# Patient Record
Sex: Female | Born: 1985 | Race: White | Hispanic: No | Marital: Single | Smoking: Never smoker
Health system: Southern US, Community
[De-identification: ages and names within clinical notes are randomized; demographics above are authoritative.]

## PROBLEM LIST (undated history)

## (undated) DIAGNOSIS — Z789 Other specified health status: Secondary | ICD-10-CM

## (undated) DIAGNOSIS — B009 Herpesviral infection, unspecified: Secondary | ICD-10-CM

## (undated) HISTORY — DX: Herpesviral infection, unspecified: B00.9

---
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
## 2008-06-27 ENCOUNTER — Other Ambulatory Visit: Admission: RE | Admit: 2008-06-27 | Discharge: 2008-06-27 | Payer: Self-pay | Admitting: Obstetrics & Gynecology

## 2008-10-22 ENCOUNTER — Inpatient Hospital Stay (HOSPITAL_COMMUNITY): Admission: AD | Admit: 2008-10-22 | Discharge: 2008-10-24 | Payer: Self-pay | Admitting: Obstetrics & Gynecology

## 2008-10-22 ENCOUNTER — Ambulatory Visit: Payer: Self-pay | Admitting: Family Medicine

## 2010-01-12 ENCOUNTER — Other Ambulatory Visit: Admission: RE | Admit: 2010-01-12 | Discharge: 2010-01-12 | Payer: Self-pay | Admitting: Obstetrics & Gynecology

## 2010-12-31 ENCOUNTER — Other Ambulatory Visit (HOSPITAL_COMMUNITY)
Admission: RE | Admit: 2010-12-31 | Discharge: 2010-12-31 | Disposition: A | Discharge status: Home / Self Care | Payer: Medicaid Other | Source: Ambulatory Visit | Attending: Obstetrics and Gynecology | Admitting: Obstetrics and Gynecology

## 2010-12-31 DIAGNOSIS — Z01419 Encounter for gynecological examination (general) (routine) without abnormal findings: Secondary | ICD-10-CM | POA: Insufficient documentation

## 2010-12-31 DIAGNOSIS — Z113 Encounter for screening for infections with a predominantly sexual mode of transmission: Secondary | ICD-10-CM | POA: Insufficient documentation

## 2011-01-02 ENCOUNTER — Encounter: Payer: Self-pay | Admitting: Obstetrics & Gynecology

## 2011-01-02 DIAGNOSIS — Z331 Pregnant state, incidental: Secondary | ICD-10-CM | POA: Insufficient documentation

## 2011-01-11 LAB — CBC
HCT: 28 % — ABNORMAL LOW (ref 36.0–46.0)
HCT: 32.4 % — ABNORMAL LOW (ref 36.0–46.0)
Hemoglobin: 11 g/dL — ABNORMAL LOW (ref 12.0–15.0)
Hemoglobin: 9.5 g/dL — ABNORMAL LOW (ref 12.0–15.0)
MCHC: 33.9 g/dL (ref 30.0–36.0)
MCHC: 34 g/dL (ref 30.0–36.0)
MCV: 92.8 fL (ref 78.0–100.0)
MCV: 93 fL (ref 78.0–100.0)
Platelets: 128 10*3/uL — ABNORMAL LOW (ref 150–400)
Platelets: 152 10*3/uL (ref 150–400)
RBC: 3.01 MIL/uL — ABNORMAL LOW (ref 3.87–5.11)
RBC: 3.49 MIL/uL — ABNORMAL LOW (ref 3.87–5.11)
RDW: 13.9 % (ref 11.5–15.5)
RDW: 14.1 % (ref 11.5–15.5)
WBC: 11.2 10*3/uL — ABNORMAL HIGH (ref 4.0–10.5)
WBC: 15.4 10*3/uL — ABNORMAL HIGH (ref 4.0–10.5)

## 2011-01-11 LAB — RH IMMUNE GLOB WKUP(>/=20WKS)(NOT WOMEN'S HOSP): Fetal Screen: NEGATIVE

## 2011-01-11 LAB — RPR: RPR Ser Ql: NONREACTIVE

## 2011-02-12 NOTE — Op Note (Signed)
NAME:  Olivia Small, Olivia Small NO.:  [id number]   MEDICAL RECORD NO.:  [id number]                   PATIENT TYPE:  INP   LOCATION:  A417                                 FACILITY:  [facility]   PHYSICIAN:  Langley Gauss, M.D.                DATE OF BIRTH:  [birth year]   DATE OF PROCEDURE:  08/16/2002  DATE OF DISCHARGE:  08/18/2002                                 OPERATIVE REPORT   PROCEDURE:  Placement of continuous lumbar epidural analgesia at the L3-4  interspace performed by Langley Gauss, M.D.   COMPLICATIONS:  There were findings of a probable wet tap prior to  successful epidural placement with findings of a small amount of clear  cerebrospinal fluid.  Epidural was thereafter placed without complications.  Plan is to place immediate blood patch following delivery and prior to  removal of the epidural.   DESCRIPTION OF PROCEDURE:  Appropriate informed consent was obtained prior  to the procedure.  The patient was placed in a seated position, bony  landmarks were identified, and the L3-4 interspace was chosen.  The  patient's back was sterilely prepped and draped utilizing the epidural kit.  Five cubic centimeters 1% lidocaine plain injected at the midline of the L3-  4 interspace to raise a small skin wheal.  The 17-gauge Tuohy-Schlif needle  was then utilized with loss of resistance to an air-filled glass syringe to  identify the epidural space.  On the initial attempt, the bony spinous  process was encountered; thus, the epidural needle was removed and directed  slightly caudad.  On this second attempt, no loss of resistance was  obtained; thus, the epidural needle was removed, at which time there was  finding of about 2 cc of clear apparently cerebrospinal fluid within the  glass syringe, indicating a probable wet tap.  On the third attempt the  epidural was easily placed with excellent loss of resistance consistent with  entry into the epidural space,  with excellent loss of resistance obtained.  Initial test dose of 5 cc 1% lidocaine plus epinephrine injected through the  epidural needle, and no signs of CSF or intravascular injection obtained;  thus, the epidural catheter was inserted to a depth of 4 cm.  Epidural  needle was removed.  Aspiration test was negative.  Second test dose of 2 cc  of 1.5% lidocaine plus epinephrine injected through the epidural catheter.  Again no signs of CSF or intravascular injection obtained; thus, the  catheter was secured into place.  The patient was returned to the left  lateral supine position, connected to the infusion pump containing the  standard mixture.  She will be treated with a bolus of 10 cc followed by a  continuous infusion rate of 14 cc/hr.  Following completion of the  procedure, the patient is having tingling and warmth in both lower  extremities, consistent with properly setting-up epidural block.  No other  signs of CSF or intravascular injection have been obtained; thus, the  patient is continued on the infusion pump.  She is notified at this time of  the probable wet tap and the possibility of a spinal headache occurring.  She, however, is having excellent labor relief at this time, fetal heart  rate remains reassuring, and thus she is continued on the infusion pump.                                               Langley Gauss, M.D.    DC/MEDQ  D:  08/20/2002  T:  08/20/2002  Job:  [id number]

## 2011-02-12 NOTE — H&P (Signed)
   NAME:  Olivia Small, Olivia Small                         ACCOUNT NO.:  [id number]   MEDICAL RECORD NO.:  [id number]                   PATIENT TYPE:  INP   LOCATION:  A417                                 FACILITY:  [facility]   PHYSICIAN:  Langley Gauss, M.D.                DATE OF BIRTH:  05-Aug-1986   DATE OF ADMISSION:  08/16/2002  DATE OF DISCHARGE:  08/18/2002                                HISTORY & PHYSICAL   HISTORY OF PRESENT ILLNESS:  A 25 year old gravida 1, para 0, 38+ week  gestation is admitted for induction of labor.  The patient's prenatal course  is complicated by late care.  Her initial visit was at [redacted] weeks gestation.  She does, however, have a certain last menstrual period of 11/22/01 and  serial ultrasounds have documented adequate fetal growth and normal anatomic  cervix.  The patient is O negative blood type.  She did receive RhoGAM  injection on 06/06/02.   PAST MEDICAL HISTORY:   PAST SURGICAL HISTORY:  Negative.   ALLERGIES:  No known drug allergies.   CURRENT MEDICATIONS:  1. Prenatal vitamins.  2. Lortabs on a p.r.n. basis for headaches throughout the pregnancy with     some relief.   PHYSICAL EXAMINATION:  GENERAL: Immature adolescent, 25 years old.  VITAL SIGNS: Height 5 feet 4 inches, blood pressure 118/70, pre-pregnancy  was 160's most recent eight 200.  Pulse of 80.  Respiratory rate of 20.  HEENT: Negative.  No adenopathy.  NECK: Supple.  Thyroid nonpalpable.  LUNGS: Clear.  CARDIOVASCULAR: Regular rate and rhythm.  ABDOMEN: Soft and nontender.  Fundal height is 36 cm.  She is vertex  presentation by [employer].  PELVIC: Reveals normal external genitalia.  No lesions or ulcerations  identified.  No vaginal bleeding or leakage of fluid.  The cervix is 3 cm  dilated, __________ % effaced, -1 station, vertex presentation.  External  monitor reveals a very strong fetal strong fetal heart rate.  There are very  good uterine contractions identified.   Estimated fetal weight is 7 pounds.   ASSESSMENT:  1. 38 + week intrauterine pregnancy.  38. 25 year old adolescent pregnancy.  3.     The cervix is favorable for induction.  4. The patient only has her mother for a support person during the course of     labor.   PLAN:  To person with amniotomy.  Thereafter augment with Pitocin or induce  with Pitocin as clinically indicated.                                               Langley Gauss, M.D.    DC/MEDQ  D:  08/20/2002  T:  08/20/2002  Job:  [id number]

## 2011-02-12 NOTE — Op Note (Signed)
   NAME:  Olivia Small, Olivia Small                         ACCOUNT NO.:  [id number]   MEDICAL RECORD NO.:  [id number]                   PATIENT TYPE:  OBV   LOCATION:  LDR1                                 FACILITY:  [facility]   PHYSICIAN:  Langley Gauss, M.D.                DATE OF BIRTH:  [birth year]   DATE OF PROCEDURE:  08/16/2002  DATE OF DISCHARGE:  08/19/2002                                 OPERATIVE REPORT   PROCEDURE PERFORMED:  Placement of immediate blood patch through the  epidural for prophylactic relief of spinal headache as a complication of a  wet tap, an accepted complication at the time of placement of the labor  epidural.   SURGEON:  Langley Gauss, M.D.   SUMMARY:  The planned procedure had been discussed with the patient during  the course of labor and again immediately just prior to performing this.  A  separate consent form has been signed.  The patient is delivered  uneventfully.  The epidural catheter is still in place.  Utilizing sterile  technique, 10 cc of the patient's own blood is obtained from the right  antecubital region.  This 10 cc of blood is then injected through the  epidural catheter with minimal resistance obtained.  Thus, placement of the  blood patch was performed through the original epidural catheter.  The  epidural catheter was then removed, and a Band-Aid was placed.  The patient  was instructed to lie flat on her back x30 minutes duration.  Pertinently,  she has no complaints of headaches during the course of labor and  immediately in the postpartum period.                                               Langley Gauss, M.D.    DC/MEDQ  D:  08/20/2002  T:  08/20/2002  Job:  [id number]

## 2011-02-12 NOTE — Discharge Summary (Signed)
NAME:  Olivia Small, Olivia Small                         ACCOUNT NO.:  [id number]   MEDICAL RECORD NO.:  [id number]                   PATIENT TYPE:  INP   LOCATION:  A417                                 FACILITY:  [facility]   PHYSICIAN:  Langley Gauss, M.D.                DATE OF BIRTH:  Dec 02, 1985   DATE OF ADMISSION:  08/16/2002  DATE OF DISCHARGE:  08/18/2002                                 DISCHARGE SUMMARY   DISCHARGE DIAGNOSES:  1. A 38+ intrauterine pregnancy, delivered.  2. Anemia secondary to blood loss.  The patient did experience post partum     bleeding secondary to uterine apnea.   PROCEDURES:  1. Placement of continuous lumbar epidural analgesia 08/16/02.  2. Placement of epidural blood patch 08/16/02.  3. Spontaneous assisted vaginal delivery of 7 pound 6 ounce female infant on     08/19/02 with repair of episiotomy.   LABORATORY DATA:  Admission hematocrit 11.3/32.5 with a white count of 9.6.  Post partum day #1 hemoglobin 9.6, hematocrit hemoglobin 27.5 with a white  count of 19.0.   HOSPITAL COURSE:  See previous dictations.  The patient was admitted, had  the epidural, and delivered on 08/16/02.  An epidural blood patch was  performed immediately at the time of delivery.  About one hour following  delivery the patient was noted to have fairly heavy vaginal bleeding with  passage of golf ball sized clots.  Thus she was reevaluated by the  physician.  Notably at this time the patient had been treated with  additional IV Pitocin solution as well as 0.2 mg of iron Methergine and 1  ampule of Hemabate with minimal relief.  When I examined the patient on the  a.m. of 08/17/02, bimanual of the intravaginal vault as well as compression  of the uterine fundus resulted in evacuation of about 100 cc of clots within  the lower uterine segment.  Excellent uterine tone was achieved immediately  following this with excellent uterine tone achieved and a fundal height now  of only 16  weeks size.  Following this, the patient was treated with an  additional 0.2 mg of iron Methergine to prevent further apnea tonic  episodes.  The patient thereafter had no excessive complaints of vaginal  bleeding.  She was able to ambulate very well.  During the remainder of the  hospital course she had no complaints of headaches.  She was able to  ambulate without difficulty.  She had no additional heavy bleeding.  On the  day of discharge the hemoglobin has now colligated to 7.6.  However, upon  examination the patient has again has no excessive bleeding.  She has no  complaints of headaches.  No dizziness or unstable gait.  Importantly no  headaches.  I discussed with the patient that we did perform the blood patch  at the time of delivery.  The patient was thus discharged  to home on  08/18/02.   DISCHARGE MEDICATIONS:  1. Tylenol No. 3.  2. Hemocyte-F 1 p.o. q.d. #30 without refills for the iron deficiency     anemia.                                               Langley Gauss, M.D.    DC/MEDQ  D:  08/20/2002  T:  08/20/2002  Job:  [id number]

## 2011-02-12 NOTE — Op Note (Signed)
NAME:  Olivia Small, Olivia Small                         ACCOUNT NO.:  [id number]   MEDICAL RECORD NO.:  [id number]                   PATIENT TYPE:  OBV   LOCATION:  LDR1                                 FACILITY:  [facility]   PHYSICIAN:  Lazaro Arms, M.D.                DATE OF BIRTH:  08-22-1986   DATE OF PROCEDURE:  08/19/2002  DATE OF DISCHARGE:  08/19/2002                                 OPERATIVE REPORT   PROCEDURE:  Blood patch.   INDICATIONS:  [name] is a 25 year old white female who delivered on Thursday,  November 20.  At that time, he had an epidural placed and evidently  according to her history as it was being placed, the patient complained of a  headache.  Again, by history, Dr. Lisette Grinder seemed to think it was probably a  wet tap and placed a blood patch at that time.  The patient went home from  the hospital yesterday, doing pretty well. She states she had some headache  throughout, but it had been better.  However, she stated that yesterday  afternoon, it started getting worse again, and it was postural.  As long as  she was lying down flat, she had no headache, but when she got up, she had a  pounding bifrontal headache.  She had no vomiting but some nausea.  Her  mother called labor and delivery this afternoon, and I talked to her after  that and confirmed the symptoms of spinal headache.  I thought at some point  that maybe this clot had become dislodged from the blood patch and she was  starting to leak spinal fluid again.  She does not have any fever, and there  is no evidence of abscess or any other infections at the sites of the needle  punctures in her back.  It appears that the previous puncture sites maybe  were L1-L2 and L2-L3.   The patient was placed in the sitting position. Her iliac crests were  identified.  The spinous processes were identified, and the L2-L3 interspace  was marked.  Betadine prep was used.  1% lidocaine was injected as a skin  wheal, and I placed  a 17-gauge Tuohy needle into the epidural space with one  pass without difficulty.  At the same time, my R.N. was drawing 20 cc of  blood.  After she had done that, I reconfirmed the epidural space and  injected 20 cc of autologous blood into the epidural space without  difficulty.  The patient had no ill effects from this.  She stated that she  got immediate relief from her headache.  I then laid her down for five  minutes and came back in and did postural changes, and again, she states she  had absolutely no headache and she was feeling great.  As a result, she was  discharged to home to follow up as scheduled.  She  was given routine  aftercare instructions.                                               Lazaro Arms, M.D.    [name]  D:  08/19/2002  T:  08/19/2002  Job:  [id number]   cc:   Langley Gauss, M.D.  [address]  [place]  [place] [address]  Fax: [phone]

## 2011-02-12 NOTE — Op Note (Signed)
NAME:  Olivia Small, Olivia Small                         ACCOUNT NO.:  [id number]   MEDICAL RECORD NO.:  [id number]                   PATIENT TYPE:  INP   LOCATION:  A417                                 FACILITY:  [facility]   PHYSICIAN:  Langley Gauss, M.D.                DATE OF BIRTH:  November 29, 1985   DATE OF PROCEDURE:  08/16/2002  DATE OF DISCHARGE:  08/18/2002                                 OPERATIVE REPORT   DIAGNOSIS:  Thirty-eight plus week intrauterine pregnancy for induction of  labor.   PROCEDURES:  1. Spontaneous assisted vaginal delivery of a 7 pound 6 ounce female infant.  2. Midline episiotomy and repair.  3. Placement of immediate blood patch following delivery.   ANALGESIA FOR DELIVERY:  Continuous lumbar epidural anesthesia.  This was  supplemented with 30 cc of 1% lidocaine at time of delivery.   PHYSICIAN:  Delivery performed by Langley Gauss, M.D.   SPECIMENS:  Arterial cord gas and cord blood obtained, sent to laboratory  for identification.  The placenta was noted to deliver intact with a three-  vessel umbilical cord.   DESCRIPTION OF PROCEDURE:  The patient was admitted for induction of labor.  Epidural had been placed with the findings of probable wet tap.  The  epidural, however, functioned very well throughout the remainder of the  labor course.  At 9 cm dilatation, though, the patient began having  significant pelvic and perineal pressure with a desire to push; thus, at  that time she was treated with a bolus of 2% lidocaine to achieve perineal  analgesia.  Thereafter the patient progressed normally along the labor curve  to complete dilatation, at which time she began having an urge to push.  She  was placed in the dorsal lithotomy position and prepped and draped in the  usual sterile manner.  The patient pushed well during a short second stage  of labor with distention of the perineum, 30 cc of 1% lidocaine injected in  the midline and perineal body.  A small  midline episiotomy was performed.  The infant then delivered in a direct OA position over this midline  episiotomy without extension.  Mouth and nares were bulb-suctioned of clear  amniotic fluid, and renewed expulsive efforts resulted in the spontaneous  rotation to a left anterior shoulder position and with gentle traction  combined with expulsive efforts, we ultimately delivered the anterior  shoulder as well as the remainder of the infant without difficulties.  A  spontaneous and vigorous breathing cry was noted.  Umbilical cord was milked  toward the infant, the cord was doubly clamped and cut, and the infant was  given to the nursing staff for immediate assessment.  Arterial cord gas and  cord blood were then obtained.  Gentle traction on the umbilical cord  resulted in separation, which upon examination appeared to be an intact  three-vessel placenta with attached cord.  Examination of the genital tract  reveals midline episiotomy that is not extended.  This is easily  repaired utilizing 0 chromic in a running locked fashion on the vaginal  mucosa, followed by a two-layer closure of 0 chromic on the perineal body.  Both the mother and infant doing well following delivery.  The patient has  no complaints of headaches.                                                 Langley Gauss, M.D.    DC/MEDQ  D:  08/20/2002  T:  08/20/2002  Job:  [id number]

## 2011-07-21 ENCOUNTER — Encounter (HOSPITAL_COMMUNITY): Payer: Self-pay

## 2011-07-21 ENCOUNTER — Inpatient Hospital Stay (HOSPITAL_COMMUNITY)
Admission: RE | Admit: 2011-07-21 | Discharge: 2011-07-22 | DRG: 775 | Disposition: A | Discharge status: Home / Self Care | Payer: Medicaid Other | Source: Ambulatory Visit | Attending: Obstetrics and Gynecology | Admitting: Obstetrics and Gynecology

## 2011-07-21 HISTORY — DX: Other specified health status: Z78.9

## 2011-07-21 LAB — ABO/RH: RH Type: NEGATIVE

## 2011-07-21 LAB — CBC
MCH: 29.9 pg (ref 26.0–34.0)
MCHC: 33.2 g/dL (ref 30.0–36.0)
Platelets: 121 10*3/uL — ABNORMAL LOW (ref 150–400)

## 2011-07-21 LAB — HEPATITIS B SURFACE ANTIGEN: Hepatitis B Surface Ag: NEGATIVE

## 2011-07-21 LAB — RPR: RPR Ser Ql: NONREACTIVE

## 2011-07-21 LAB — STREP B DNA PROBE: GBS: POSITIVE

## 2011-07-21 LAB — RUBELLA ANTIBODY, IGM: Rubella: IMMUNE

## 2011-07-21 MED ORDER — LACTATED RINGERS IV SOLN: 500.0000 mL | INTRAVENOUS | Status: DC | PRN

## 2011-07-21 MED ORDER — WITCH HAZEL-GLYCERIN EX PADS: 1.0000 "application " | MEDICATED_PAD | CUTANEOUS | Status: DC | PRN

## 2011-07-21 MED ORDER — ONDANSETRON HCL 4 MG/2ML IJ SOLN: 4.0000 mg | INTRAMUSCULAR | Status: DC | PRN

## 2011-07-21 MED ORDER — BENZOCAINE-MENTHOL 20-0.5 % EX AERO: 1.0000 "application " | INHALATION_SPRAY | CUTANEOUS | Status: DC | PRN

## 2011-07-21 MED ORDER — PENICILLIN G POTASSIUM 5000000 UNITS IJ SOLR
5.0000 10*6.[IU] | Freq: Once | INTRAVENOUS | Status: AC
  Administered 2011-07-21: 5 10*6.[IU] via INTRAVENOUS
  Filled 2011-07-21: qty 5

## 2011-07-21 MED ORDER — OXYCODONE-ACETAMINOPHEN 5-325 MG PO TABS: 1.0000 | ORAL_TABLET | ORAL | Status: DC | PRN

## 2011-07-21 MED ORDER — NALBUPHINE SYRINGE 5 MG/0.5 ML
5.0000 mg | INJECTION | INTRAMUSCULAR | Status: DC | PRN
  Administered 2011-07-21: 5 mg via INTRAVENOUS
  Filled 2011-07-21 (×2): qty 0.5

## 2011-07-21 MED ORDER — PRENATAL PLUS 27-1 MG PO TABS
1.0000 | ORAL_TABLET | Freq: Every day | ORAL | Status: DC
  Administered 2011-07-22: 1 via ORAL
  Filled 2011-07-21: qty 1

## 2011-07-21 MED ORDER — PENICILLIN G POTASSIUM 5000000 UNITS IJ SOLR
2.5000 10*6.[IU] | INTRAVENOUS | Status: DC
  Administered 2011-07-21 (×2): 2.5 10*6.[IU] via INTRAVENOUS
  Filled 2011-07-21 (×5): qty 2.5

## 2011-07-21 MED ORDER — SIMETHICONE 80 MG PO CHEW: 80.0000 mg | CHEWABLE_TABLET | ORAL | Status: DC | PRN

## 2011-07-21 MED ORDER — ACETAMINOPHEN 325 MG PO TABS: 650.0000 mg | ORAL_TABLET | ORAL | Status: DC | PRN

## 2011-07-21 MED ORDER — ONDANSETRON HCL 4 MG/2ML IJ SOLN: 4.0000 mg | Freq: Four times a day (QID) | INTRAMUSCULAR | Status: DC | PRN

## 2011-07-21 MED ORDER — PHENYLEPHRINE 40 MCG/ML (10ML) SYRINGE FOR IV PUSH (FOR BLOOD PRESSURE SUPPORT)
80.0000 ug | PREFILLED_SYRINGE | INTRAVENOUS | Status: DC | PRN
  Filled 2011-07-21: qty 5

## 2011-07-21 MED ORDER — IBUPROFEN 600 MG PO TABS
600.0000 mg | ORAL_TABLET | Freq: Four times a day (QID) | ORAL | Status: DC | PRN
  Filled 2011-07-21: qty 1

## 2011-07-21 MED ORDER — LACTATED RINGERS IV SOLN: 500.0000 mL | Freq: Once | INTRAVENOUS | Status: DC

## 2011-07-21 MED ORDER — LACTATED RINGERS IV SOLN
INTRAVENOUS | Status: DC
  Administered 2011-07-21: 09:00:00 via INTRAVENOUS

## 2011-07-21 MED ORDER — ONDANSETRON HCL 4 MG PO TABS: 4.0000 mg | ORAL_TABLET | ORAL | Status: DC | PRN

## 2011-07-21 MED ORDER — DIPHENHYDRAMINE HCL 25 MG PO CAPS: 25.0000 mg | ORAL_CAPSULE | Freq: Four times a day (QID) | ORAL | Status: DC | PRN

## 2011-07-21 MED ORDER — TERBUTALINE SULFATE 1 MG/ML IJ SOLN: 0.2500 mg | Freq: Once | INTRAMUSCULAR | Status: DC | PRN

## 2011-07-21 MED ORDER — OXYTOCIN 20 UNITS IN LACTATED RINGERS INFUSION - SIMPLE: 125.0000 mL/h | Freq: Once | INTRAVENOUS | Status: DC

## 2011-07-21 MED ORDER — ZOLPIDEM TARTRATE 5 MG PO TABS: 5.0000 mg | ORAL_TABLET | Freq: Every evening | ORAL | Status: DC | PRN

## 2011-07-21 MED ORDER — OXYTOCIN 20 UNITS IN LACTATED RINGERS INFUSION - SIMPLE
1.0000 m[IU]/min | INTRAVENOUS | Status: DC
  Administered 2011-07-21: 2 m[IU]/min via INTRAVENOUS

## 2011-07-21 MED ORDER — DIBUCAINE 1 % RE OINT: 1.0000 "application " | TOPICAL_OINTMENT | RECTAL | Status: DC | PRN

## 2011-07-21 MED ORDER — EPHEDRINE 5 MG/ML INJ
10.0000 mg | INTRAVENOUS | Status: DC | PRN
  Filled 2011-07-21: qty 4

## 2011-07-21 MED ORDER — LANOLIN HYDROUS EX OINT: TOPICAL_OINTMENT | CUTANEOUS | Status: DC | PRN

## 2011-07-21 MED ORDER — DIPHENHYDRAMINE HCL 50 MG/ML IJ SOLN: 12.5000 mg | INTRAMUSCULAR | Status: DC | PRN

## 2011-07-21 MED ORDER — FENTANYL 2.5 MCG/ML BUPIVACAINE 1/10 % EPIDURAL INFUSION (WH - ANES): 14.0000 mL/h | INTRAMUSCULAR | Status: DC

## 2011-07-21 MED ORDER — FLEET ENEMA 7-19 GM/118ML RE ENEM: 1.0000 | ENEMA | RECTAL | Status: DC | PRN

## 2011-07-21 MED ORDER — IBUPROFEN 600 MG PO TABS
600.0000 mg | ORAL_TABLET | Freq: Four times a day (QID) | ORAL | Status: DC
  Administered 2011-07-21 – 2011-07-22 (×4): 600 mg via ORAL
  Filled 2011-07-21 (×3): qty 1

## 2011-07-21 MED ORDER — LIDOCAINE HCL (PF) 1 % IJ SOLN
30.0000 mL | INTRAMUSCULAR | Status: DC | PRN
  Filled 2011-07-21: qty 30

## 2011-07-21 MED ORDER — CITRIC ACID-SODIUM CITRATE 334-500 MG/5ML PO SOLN: 30.0000 mL | ORAL | Status: DC | PRN

## 2011-07-21 MED ORDER — OXYCODONE-ACETAMINOPHEN 5-325 MG PO TABS: 2.0000 | ORAL_TABLET | ORAL | Status: DC | PRN

## 2011-07-21 MED ORDER — TETANUS-DIPHTH-ACELL PERTUSSIS 5-2.5-18.5 LF-MCG/0.5 IM SUSP: 0.5000 mL | Freq: Once | INTRAMUSCULAR | Status: DC

## 2011-07-21 MED ORDER — OXYTOCIN BOLUS FROM INFUSION
500.0000 mL | Freq: Once | INTRAVENOUS | Status: DC
  Filled 2011-07-21: qty 500
  Filled 2011-07-21: qty 1000

## 2011-07-21 MED ORDER — SENNOSIDES-DOCUSATE SODIUM 8.6-50 MG PO TABS: 2.0000 | ORAL_TABLET | Freq: Every day | ORAL | Status: DC

## 2011-07-21 NOTE — Progress Notes (Signed)
   Olivia Small is a 25 y.o. [phone] at [redacted]w[redacted]d  admitted for induction of labor due to Elective at term.  Subjective: "I feel like I'm dying".  Does not want epidural  Objective: BP 130/85  Pulse 103  Temp(Src) 97.5 F (36.4 C) (Oral)  Resp 20  Ht 5\' 7"  (1.702 m)  Wt 105.688 kg (233 lb)  BMI 36.49 kg/m2  LMP 10/22/2010    FHT:  FHR: 140 bpm, variability: moderate,  accelerations:  Present,  decelerations:  Absent UC:   regular, every 2-3 minutes SVE:   Dilation: 6 Effacement (%): 90 Station: -2;-1 Exam by:: McGrail RN  Labs: Lab Results  Component Value Date   WBC 11.1* 07/21/2011   HGB 10.6* 07/21/2011   HCT 31.9* 07/21/2011   MCV 90.1 07/21/2011   PLT 121* 07/21/2011    Assessment / Plan: Induction of labor due to hx precip delivery in ambulance,  progressing well on pitocin  Labor: Progressing normally Fetal Wellbeing:  Category I Pain Control:  will get in bath tub Anticipated MOD:  NSVD  Olivia Small 07/21/2011, 5:36 PM

## 2011-07-21 NOTE — H&P (Signed)
Subjective:  Olivia Small is a 25 y.o. G3 P2-0-0-2 female with [facility] 07/29/2011 at 33 and 0/[redacted] weeks gestation who is being admitted for induction of labor.  Her current obstetrical history is significant for precipitous delivery with the last pregnancy while en route from [place] to [facility] .  Patient reports mild contractions since last office visit when membranes were "stripped" on 07/20/2011.   Fetal Movement: normal.   No rupture of membranes or bleeding.  Objective:   Vital signs in last 24 hours:     General:   alert, cooperative, appears stated age and in good overall health.  Skin:   normal  HEENT:  PERRLA  Lungs:   clear to auscultation bilaterally  Heart:   regular rate and rhythm, S1, S2 normal, no murmur, click, rub or gallop     Abdomen:  gravid uterus, consistent with dates,, 38 cm fundal height  Pelvis:  Cervix: 3cm/ 60% effaced/ -1-2 sta Uterus: 38 cm Fundal ht  FHT:  140's BPM  Uterine Size: size equals dates  Presentations: cephalic  Cervix:    Dilation: 3cm   Effacement: 60   Station:  -1   Consistency: medium   Position: middle   Lab Review  O, Rh -  ZOX:WRUEAVW declined  One hour GTT: Normal 2 hr test    Assessment/Plan:  39 and 0/[redacted] weeks gestation. Not in labor. Obstetrical history significant for precipitous delivery on way to hospital with last pregnancy.     Risks, benefits, alternatives and possible complications have been discussed in detail with the patient.  Pre-admission, admission, and post admission procedures and expectations were discussed in detail.  All questions answered, all appropriate consents will be signed at the Hospital. Admission is planned for today.  Anticipate vaginal delivery.

## 2011-07-21 NOTE — Op Note (Signed)
Delivery Note At 6:07 PM a viable and healthy female was delivered via Vaginal, Spontaneous Delivery (Presentation:occiput anterior ;  ).  APGAR: 9, 9; weight 9 lb 1 oz (4111 g). Mother was completely dilated upon completion of time in bathtub  Placenta status: Intact, Spontaneous.  Cord: 3 vessels with the following complications: None.  Cord pH:   Anesthesia: None  Episiotomy: None Lacerations: 1st degree Suture Repair: none Est. Blood Loss (mL): 250  Mom to postpartum.  Baby to nursery-stable.  Sharri Loya V 07/21/2011, 6:29 PM

## 2011-07-21 NOTE — Plan of Care (Signed)
Problem: Consults Goal: Birthing Suites Patient Information Press F2 to bring up selections list   Pt 37-[redacted] weeks EGA     

## 2011-07-21 NOTE — Progress Notes (Signed)
Olivia Small is a 25 y.o. [phone] at [redacted]w[redacted]d by LMP admitted for induction of labor due to precipitous delivery. Feeling contractions and has received one dose of nubain.  Objective: BP 127/67  Pulse 101  Temp(Src) 97.5 F (36.4 C) (Oral)  Resp 20  Ht 5\' 7"  (1.702 m)  Wt 105.688 kg (233 lb)  BMI 36.49 kg/m2  LMP 10/22/2010      FHT:  FHR: 130-140 bpm, variability: moderate,  accelerations:  Present,  decelerations:  Absent UC:   irregular, every 3-5 minutes SVE:   Dilation: 5 Effacement (%): 80 Station: -2 Exam by:: [place]  Labs: Lab Results  Component Value Date   WBC 11.1* 07/21/2011   HGB 10.6* 07/21/2011   HCT 31.9* 07/21/2011   MCV 90.1 07/21/2011   PLT 121* 07/21/2011    Assessment / Plan: Induction of labor due to precipitous delivery,  progressing well on pitocin, on 16 mU/min  Labor: Progressing on Pitocin, will continue to increase then AROM - AROM'd and clear at 1615 Fetal Wellbeing:  Category I Pain Control:  nubain I/D:  PCN for GBS+ Anticipated MOD:  NSVD  Bosco Paparella N 07/21/2011, 4:51 PM

## 2011-07-21 NOTE — Progress Notes (Signed)
Olivia Small is a 25 y.o. [phone] at [redacted]w[redacted]d admitted for induction of labor due to history of precipitous delivery, already dilated to 4 cm in clinic. Patient is just now reporting mild cramping.  Objective: BP 113/64  Pulse 93  Temp(Src) 98.2 F (36.8 C) (Oral)  Resp 20  Ht 5\' 7"  (1.702 m)  Wt 105.688 kg (233 lb)  BMI 36.49 kg/m2  LMP 10/22/2010      FHT:  FHR: 140s bpm, variability: moderate,  accelerations:  Present,  decelerations:  Absent UC:   Irregular  SVE:   Dilation: 3.5 Effacement (%): 70;80 Station: -2 Exam by:: Paschal RN  Labs: Lab Results  Component Value Date   WBC 11.1* 07/21/2011   HGB 10.6* 07/21/2011   HCT 31.9* 07/21/2011   MCV 90.1 07/21/2011   PLT 121* 07/21/2011    Assessment / Plan: Induction of labor due to precipitous delivery,  progressing well on pitocin, currently at 10 mU/min Fetal Wellbeing:  Category I Pain Control:  Labor support without medications I/D:  on PCN For GBS + Anticipated MOD:  NSVD  Dwayne Begay N 07/21/2011, 12:18 PM

## 2011-07-22 MED ORDER — IBUPROFEN 600 MG PO TABS: 600.0000 mg | ORAL_TABLET | Freq: Four times a day (QID) | ORAL | Status: AC

## 2011-07-22 MED ORDER — DOCUSATE SODIUM 100 MG PO CAPS: 100.0000 mg | ORAL_CAPSULE | Freq: Two times a day (BID) | ORAL | Status: AC

## 2011-07-22 NOTE — Progress Notes (Signed)
UR Chart review completed.  

## 2011-07-22 NOTE — Progress Notes (Addendum)
I have seen and examined this patient in conjunction with [facility], [facility].  I have taken this history and preformed the exam.  I agree with the note as written above and have made corrections as needed. Patient would like to go home today, pending clearance from pediatrics.  Olivia Small 07/22/2011 10:11 AM

## 2011-07-22 NOTE — Discharge Summary (Signed)
Obstetric Discharge Summary Reason for Admission: induction of labor Prenatal Procedures: none Intrapartum Procedures: spontaneous vaginal delivery Postpartum Procedures: none Complications-Operative and Postpartum: 1st degree perineal laceration Hemoglobin  Date Value Range Status  07/21/2011 10.6* 12.0-15.0 (g/dL) Final     HCT  Date Value Range Status  07/21/2011 31.9* 36.0-46.0 (%) Final    Discharge Diagnoses: Term Pregnancy-delivered  Discharge Information: Date: 07/22/2011 Activity: pelvic rest Diet: routine Medications: PNV, Ibuprofen and Colace Condition: stable Instructions: refer to practice specific booklet Discharge to: home Follow-up Information    Follow up with [facility]. Make an appointment in 6 weeks. (for postpartum visit)          Newborn Data: Live born female  Birth Weight: 9 lb 1 oz (4111 g) APGAR: 9, 9  Home with mother.  Olivia Small 07/22/2011, 10:14 AM

## 2011-07-22 NOTE — Progress Notes (Signed)
Post Partum Day 1 Subjective: no complaints, up ad lib, voiding, tolerating PO and + flatus 25yo female, [id number], s/pSVD.  No complaints.  Denies abd pain/cramping, dysuria, BM. Small amount of bleeding and lochia.  Breastfeeding without difficulty.  Ambulating without difficulty.  Objective: Blood pressure 120/80, pulse 91, temperature 98.1 F (36.7 C), temperature source Oral, resp. rate 18, height 5\' 7"  (1.702 m), weight 105.688 kg (233 lb), last menstrual period 10/22/2010, unknown if currently breastfeeding.  Physical Exam:  General: alert, cooperative, appears stated age and no distress Resp: clear and equal bilaterally, no wheezing/rales/rhonchi Cardiac: RRR. No murmurs/rubs/gallops Abd: soft, nontender, no masses Uterine Fundus: firm DVT Evaluation: No evidence of DVT seen on physical exam; No cords or calf tenderness; No significant calf/ankle edema.   Basename 07/21/11 0820  HGB 10.6*  HCT 31.9*    Assessment/Plan: Plan for discharge tomorrow, Breastfeeding and Contraception Mirena; will make 6 week appointment with OB 1. S/p SVD of baby boy  A. Continue current care  B. Plan for discharge home tomorrow  C. Will make F/U appt for 6 weeks with OB    LOS: 1 day   Dierdre Forth 07/22/2011, 9:52 AM

## 2011-09-24 ENCOUNTER — Emergency Department (HOSPITAL_COMMUNITY): Payer: Medicaid Other

## 2011-09-24 ENCOUNTER — Encounter (HOSPITAL_COMMUNITY): Payer: Self-pay | Admitting: Emergency Medicine

## 2011-09-24 ENCOUNTER — Emergency Department (HOSPITAL_COMMUNITY)
Admission: EM | Admit: 2011-09-24 | Discharge: 2011-09-24 | Disposition: A | Discharge status: Home / Self Care | Payer: Medicaid Other | Attending: Emergency Medicine | Admitting: Emergency Medicine

## 2011-09-24 DIAGNOSIS — R197 Diarrhea, unspecified: Secondary | ICD-10-CM | POA: Insufficient documentation

## 2011-09-24 DIAGNOSIS — R509 Fever, unspecified: Secondary | ICD-10-CM | POA: Insufficient documentation

## 2011-09-24 DIAGNOSIS — R05 Cough: Secondary | ICD-10-CM | POA: Insufficient documentation

## 2011-09-24 DIAGNOSIS — R1110 Vomiting, unspecified: Secondary | ICD-10-CM | POA: Insufficient documentation

## 2011-09-24 DIAGNOSIS — R059 Cough, unspecified: Secondary | ICD-10-CM | POA: Insufficient documentation

## 2011-09-24 NOTE — ED Provider Notes (Signed)
History   This chart was scribed for Joya Gaskins, MD by Charolett Bumpers . The patient was seen in room APFT23/APFT23 and the patient's care was started at 6:30pm.  CSN: [id number]  Arrival date & time 09/24/11  1737   First MD Initiated Contact with Patient 09/24/11 1827      Chief Complaint  Patient presents with  . Cough     HPI Olivia Small is a 25 y.o. female who presents to the Emergency Department complaining of constant, moderate cough with associated fever, vomiting, and diarrhea with an onset of about 6 days ago. She denies SOB, abdominal pain, and leg swelling. Patient states she has no major health problems, and no h/o smoking or asthma. She had similar symptoms about a year ago associated with pneumonia. Patient is currently on antibiotics for pneumonia that was dx by the health department.    Past Medical History  Diagnosis Date  . 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 No pertinent past medical history     History reviewed. No pertinent past surgical history.  Family History  Problem Relation Age of Onset  . Diabetes Father     History  Substance Use Topics  . Smoking status: Never Smoker   . Smokeless tobacco: Not on file  . Alcohol Use: No    OB History    Grav Para Term Preterm Abortions TAB SAB Ect Mult Living   4 3 3  0 0 0 0 0 0 3      Review of Systems A complete 10 system review of systems was obtained and is otherwise negative except as noted in the HPI and PMH.   Allergies  Review of patient's allergies indicates no known allergies.  Home Medications   Current Outpatient Rx  Name Route Sig Dispense Refill  . PREFERA OB PO Oral Take 1 tablet by mouth daily.        BP 127/74  Pulse 68  Temp 99 F (37.2 C)  Resp 20  Ht 5\' 6"  (1.676 m)  Wt 220 lb (99.791 kg)  BMI 35.51 kg/m2  SpO2 99%  LMP 09/23/2011  Physical Exam CONSTITUTIONAL: Well developed/well  nourished HEAD AND FACE: Normocephalic/atraumatic EYES: EOMI/PERRL ENMT: Mucous membranes moist NECK: supple no meningeal signs SPINE:entire spine nontender CV: S1/S2 noted, no murmurs/rubs/gallops noted LUNGS: Crackles at left base, otherwise normal, no distress ABDOMEN: soft, nontender, no rebound or guarding GU:no cva tenderness NEURO: Pt is awake/alert, moves all extremitiesx4 EXTREMITIES: pulses normal, full ROM SKIN: warm, color normal PSYCH: no abnormalities of mood noted  ED Course  Procedures   Pt stable, already on abx, no need for CXR at this time   MDM  Nursing notes reviewed and considered in documentation   I personally performed the services described in this documentation, which was scribed in my presence. The recorded information has been reviewed and considered.       Joya Gaskins, MD 09/24/11 [phone]

## 2011-09-24 NOTE — ED Notes (Signed)
Pt c/o cough/congestion x 6 days. Pt states she was dx with pnuemonia by health department without a chest xray. Pt wants chest xray.

## 2011-09-24 NOTE — ED Notes (Signed)
Dr. Bebe Shaggy in to see patient prior to my assessment.

## 2012-04-27 ENCOUNTER — Encounter (HOSPITAL_COMMUNITY): Payer: Self-pay | Admitting: Emergency Medicine

## 2012-04-27 ENCOUNTER — Emergency Department (HOSPITAL_COMMUNITY)
Admission: EM | Admit: 2012-04-27 | Discharge: 2012-04-27 | Disposition: A | Discharge status: Home / Self Care | Payer: Medicaid Other | Attending: Emergency Medicine | Admitting: Emergency Medicine

## 2012-04-27 ENCOUNTER — Emergency Department (HOSPITAL_COMMUNITY): Payer: Medicaid Other

## 2012-04-27 DIAGNOSIS — K8020 Calculus of gallbladder without cholecystitis without obstruction: Secondary | ICD-10-CM | POA: Insufficient documentation

## 2012-04-27 MED ORDER — OXYCODONE-ACETAMINOPHEN 5-325 MG PO TABS: 1.0000 | ORAL_TABLET | ORAL | Status: AC | PRN

## 2012-04-27 NOTE — ED Notes (Signed)
Intermittent pain to upper abd radiating to back for several months. Has been constant since last night which is new. Pt states has some SOB and nausea with episodes of vomiting when it hits.

## 2012-04-27 NOTE — ED Provider Notes (Signed)
History   This chart was scribed for Flint Melter, MD by Sofie Rower. The patient was seen in room APA04/APA04 and the patient's care was started at 9:55 AM     CSN: [id number]  Arrival date & time 04/27/12  0943   None     Chief Complaint  Patient presents with  . Abdominal Pain    (Consider location/radiation/quality/duration/timing/severity/associated sxs/prior treatment) Patient is a 26 y.o. female presenting with abdominal pain. The history is provided by the patient. No language interpreter was used.  Abdominal Pain The primary symptoms of the illness include abdominal pain. The current episode started yesterday. The onset of the illness was gradual. The problem has been gradually worsening.  The abdominal pain began yesterday. The pain came on gradually. The abdominal pain has been gradually worsening since its onset. The abdominal pain is located in the epigastric region. The abdominal pain does not radiate. The abdominal pain is relieved by nothing.  The patient has not had a change in bowel habit.    DANICA CAMARENA is a 26 y.o. female who presents to the Emergency Department complaining of gradual, progressively worsening abdominal pain located epigastrically onset yesterday with associated symptoms of nausea, vomiting, and shortness of breath.The pt reports the pain is as if someone is sitting on her stomach , worse at night, and presents itself  after she has been asleep for a few hours. Pt rates the abdominal pain as a 6/10 at present. Modifying factors include taking Tums which does not provide any relief of the abdominal pain. The pt denies fever, cough, leg swelling, difficulty walking, abnormal bowel movements, constipation, dysuria, taking any medications, and allergies to any medications. The pt does not smoke or drink alcohol.   PCP is [facility].    Past Medical History  Diagnosis Date  . 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 No pertinent past medical history     History reviewed. No pertinent past surgical history.  Family History  Problem Relation Age of Onset  . Diabetes Father     History  Substance Use Topics  . Smoking status: Never Smoker   . Smokeless tobacco: Not on file  . Alcohol Use: No    OB History    Grav Para Term Preterm Abortions TAB SAB Ect Mult Living   4 3 3  0 0 0 0 0 0 3      Review of Systems  Gastrointestinal: Positive for abdominal pain.  All other systems reviewed and are negative.    Allergies  Review of patient's allergies indicates no known allergies.  Home Medications   Current Outpatient Rx  Name Route Sig Dispense Refill  . NORGESTREL-ETHINYL ESTRADIOL 0.3-30 MG-MCG PO TABS Oral Take 1 tablet by mouth daily.    . OXYCODONE-ACETAMINOPHEN 5-325 MG PO TABS Oral Take 1 tablet by mouth every 4 (four) hours as needed for pain. 30 tablet 0    BP 137/82  Pulse 88  Temp 98.1 F (36.7 C) (Oral)  Ht 5\' 6"  (1.676 m)  Wt 230 lb (104.327 kg)  BMI 37.12 kg/m2  SpO2 98%  LMP 03/28/2012  Breastfeeding? No  Physical Exam  Nursing note and vitals reviewed. Constitutional: She is oriented to person, place, and time. She appears well-developed and well-nourished. No distress.  HENT:  Head: Normocephalic and atraumatic.  Mouth/Throat: Oropharynx is clear and moist.  Eyes: Conjunctivae and EOM are normal. No  scleral icterus.  Neck: Neck supple. No tracheal deviation present.  Cardiovascular: Normal rate, regular rhythm and normal heart sounds.   Pulmonary/Chest: Effort normal and breath sounds normal. No respiratory distress.  Abdominal: Soft. She exhibits no distension. There is tenderness (epigastric but no upper quadrant tenderness.).  Musculoskeletal: Normal range of motion.  Neurological: She is alert and oriented to person, place, and time. No sensory deficit.  Skin: Skin is dry.  Psychiatric: She has a normal mood and  affect. Her behavior is normal.    ED Course  Procedures (including critical care time)   Medications  norgestrel-ethinyl estradiol (LO/OVRAL,CRYSELLE) 0.3-30 MG-MCG tablet (not administered)  oxyCODONE-acetaminophen (PERCOCET/ROXICET) 5-325 MG per tablet (not administered)     DIAGNOSTIC STUDIES: Oxygen Saturation is 98% on room air, normal by my interpretation.    COORDINATION OF CARE:  10:00AM- Treatment plan discussed with patient. Patient agrees with treatment. Ultrasound ordered.   12:32PM- Pt rates pain 6/10 at present. Gallstones, pain management, and follow up with surgeon.   12:51PM- Phone Consultation. EDP speaks with Dr. Leticia Penna. Dr. Leticia Penna advises for pt follow up on Tuesday, 05/02/12.   Date: 04/27/2012  Rate: 77  Rhythm: normal sinus rhythm  QRS Axis: right  Intervals: normal  ST/T Wave abnormalities: normal  Conduction Disutrbances:none  Narrative Interpretation: [facility]  Old EKG Reviewed: none available   Labs Reviewed - No data to display US Abdomen Complete  04/27/2012  *RADIOLOGY REPORT*  Clinical Data:  Abdominal pain  ABDOMINAL ULTRASOUND COMPLETE  Comparison:  None.  Findings:  Gallbladder: Multiple small, mobile gallstones are present within the gallbladder lumen.  No gallbladder wall thickening or pericholecystic fluid. Negative sonographic Murphy's sign.  Common Bile Duct:  Within normal limits in caliber. Measures 5 mm.  Liver: No focal mass lesion identified.  Within normal limits in parenchymal echogenicity.  IVC:  Appears normal.  Pancreas:  No abnormality identified.  Spleen:  Within normal limits in size and echotexture.  Right kidney:  Normal in size and parenchymal echogenicity.  No evidence of mass or hydronephrosis.  Left kidney:  Normal in size and parenchymal echogenicity.  No evidence of mass or hydronephrosis.  Abdominal Aorta:  No aneurysm identified.  IMPRESSION: There is cholelithiasis without sonographic evidence of cholecystitis.  Original  Report Authenticated By: Brandon Melnick, M.D.      1. Cholelithiasis       MDM  Cholelithiasis without complicating features. Doubt metabolic instability, serious bacterial infection or impending vascular collapse; the patient is stable for discharge.   Plan: Home Medications- Percocet; Home Treatments- low fat diet; Recommended follow up- Gen. Surg. F/u in 5 days    I personally performed the services described in this documentation, which was scribed in my presence. The recorded information has been reviewed and considered.     Flint Melter, MD 04/27/12 [phone]

## 2012-04-27 NOTE — ED Notes (Signed)
General Surgery paged to [phone]

## 2012-05-08 ENCOUNTER — Encounter (HOSPITAL_COMMUNITY): Payer: Self-pay

## 2012-05-08 ENCOUNTER — Encounter (HOSPITAL_COMMUNITY)
Admission: RE | Admit: 2012-05-08 | Discharge: 2012-05-08 | Payer: Medicaid Other | Source: Ambulatory Visit | Attending: General Surgery | Admitting: General Surgery

## 2012-05-08 LAB — BASIC METABOLIC PANEL
CO2: 26 mEq/L (ref 19–32)
Chloride: 103 mEq/L (ref 96–112)
Glucose, Bld: 88 mg/dL (ref 70–99)
Sodium: 137 mEq/L (ref 135–145)

## 2012-05-08 LAB — CBC WITH DIFFERENTIAL/PLATELET
Eosinophils Relative: 1 % (ref 0–5)
HCT: 36.9 % (ref 36.0–46.0)
Lymphocytes Relative: 39 % (ref 12–46)
Lymphs Abs: 2 10*3/uL (ref 0.7–4.0)
MCH: 32.2 pg (ref 26.0–34.0)
MCV: 92 fL (ref 78.0–100.0)
Monocytes Absolute: 0.3 10*3/uL (ref 0.1–1.0)
Monocytes Relative: 6 % (ref 3–12)
RBC: 4.01 MIL/uL (ref 3.87–5.11)
WBC: 5.1 10*3/uL (ref 4.0–10.5)

## 2012-05-08 LAB — SURGICAL PCR SCREEN
MRSA, PCR: NEGATIVE
Staphylococcus aureus: NEGATIVE

## 2012-05-08 LAB — HCG, QUANTITATIVE, PREGNANCY: hCG, Beta Chain, Quant, S: <1 m[IU]/mL (ref <5)

## 2012-05-08 NOTE — Patient Instructions (Addendum)
[address]  05/08/2012   Your procedure is scheduled on:  05/10/12  Report to [facility] at 07:00 AM.  Call this number if you have problems the morning of surgery: [phone]   Remember:   Do not eat or drink:After Midnight.  Take these medicines the morning of surgery with A SIP OF WATER: Lo/Ovral   Do not wear jewelry, make-up or nail polish.  Do not wear lotions, powders, or perfumes. You may wear deodorant.  Do not shave 48 hours prior to surgery. Men may shave face and neck.  Do not bring valuables to the hospital.  Contacts, dentures or bridgework may not be worn into surgery.  Leave suitcase in the car. After surgery it may be brought to your room.  For patients admitted to the hospital, checkout time is 11:00 AM the day of discharge.   Patients discharged the day of surgery will not be allowed to drive home.  Special Instructions: CHG Shower Use Special Wash: 1/2 bottle night before surgery and 1/2 bottle morning of surgery.   Please read over the following fact sheets that you were given: Pain Booklet, MRSA Information, Surgical Site Infection Prevention, Anesthesia Post-op Instructions and Care and Recovery After Surgery    Laparoscopic Cholecystectomy Laparoscopic cholecystectomy is surgery to remove the gallbladder. The gallbladder is located slightly to the right of center in the abdomen, behind the liver. It is a concentrating and storage sac for the bile produced in the liver. Bile aids in the digestion and absorption of fats. Gallbladder disease (cholecystitis) is an inflammation of your gallbladder. This condition is usually caused by a buildup of gallstones (cholelithiasis) in your gallbladder. Gallstones can block the flow of bile, resulting in inflammation and pain. In severe cases, emergency surgery may be required. When emergency surgery is not required, you will have time to prepare for the procedure. Laparoscopic surgery is an alternative to open surgery.  Laparoscopic surgery usually has a shorter recovery time. Your common bile duct may also need to be examined and explored. Your caregiver will discuss this with you if he or she feels this should be done. If stones are found in the common bile duct, they may be removed. LET YOUR CAREGIVER KNOW ABOUT:  Allergies to food or medicine.   Medicines taken, including vitamins, herbs, eyedrops, over-the-counter medicines, and creams.   Use of steroids (by mouth or creams).   Previous problems with anesthetics or numbing medicines.   History of bleeding problems or blood clots.   Previous surgery.   Other health problems, including diabetes and kidney problems.   Possibility of pregnancy, if this applies.  RISKS AND COMPLICATIONS All surgery is associated with risks. Some problems that may occur following this procedure include:  Infection.   Damage to the common bile duct, nerves, arteries, veins, or other internal organs such as the stomach or intestines.   Bleeding.   A stone may remain in the common bile duct.  BEFORE THE PROCEDURE  Do not take aspirin for 3 days prior to surgery or blood thinners for 1 week prior to surgery.   Do not eat or drink anything after midnight the night before surgery.   Let your caregiver know if you develop a cold or other infectious problem prior to surgery.   You should be present 60 minutes before the procedure or as directed.  PROCEDURE  You will be given medicine that makes you sleep (general anesthetic). When you are asleep, your surgeon will make  several small cuts (incisions) in your abdomen. One of these incisions is used to insert a small, lighted scope (laparoscope) into the abdomen. The laparoscope helps the surgeon see into your abdomen. Carbon dioxide gas will be pumped into your abdomen. The gas allows more room for the surgeon to perform your surgery. Other operating instruments are inserted through the other incisions. Laparoscopic  procedures may not be appropriate when:  There is major scarring from previous surgery.   The gallbladder is extremely inflamed.   There are bleeding disorders or unexpected cirrhosis of the liver.   A pregnancy is near term.   Other conditions make the laparoscopic procedure impossible.  If your surgeon feels it is not safe to continue with a laparoscopic procedure, he or she will perform an open abdominal procedure. In this case, the surgeon will make an incision to open the abdomen. This gives the surgeon a larger view and field to work within. This may allow the surgeon to perform procedures that sometimes cannot be performed with a laparoscope alone. Open surgery has a longer recovery time. AFTER THE PROCEDURE  You will be taken to the recovery area where a nurse will watch and check your progress.   You may be allowed to go home the same day.   Do not resume physical activities until directed by your caregiver.   You may resume a normal diet and activities as directed.  Document Released: 09/13/2005 Document Revised: 09/02/2011 Document Reviewed: 02/26/2011 [facility] Patient Information 2012 [place], [place].   PATIENT INSTRUCTIONS POST-ANESTHESIA  IMMEDIATELY FOLLOWING SURGERY:  Do not drive or operate machinery for the first twenty four hours after surgery.  Do not make any important decisions for twenty four hours after surgery or while taking narcotic pain medications or sedatives.  If you develop intractable nausea and vomiting or a severe headache please notify your doctor immediately.  FOLLOW-UP:  Please make an appointment with your surgeon as instructed. You do not need to follow up with anesthesia unless specifically instructed to do so.  WOUND CARE INSTRUCTIONS (if applicable):  Keep a dry clean dressing on the anesthesia/puncture wound site if there is drainage.  Once the wound has quit draining you may leave it open to air.  Generally you should leave the bandage  intact for twenty four hours unless there is drainage.  If the epidural site drains for more than 36-48 hours please call the anesthesia department.  QUESTIONS?:  Please feel free to call your physician or the hospital operator if you have any questions, and they will be happy to assist you.

## 2012-05-09 MED ORDER — HEPARIN SODIUM (PORCINE) 5000 UNIT/ML IJ SOLN: 5000.0000 [IU] | Freq: Once | INTRAMUSCULAR | Status: DC

## 2012-05-10 ENCOUNTER — Ambulatory Visit (HOSPITAL_COMMUNITY): Payer: Medicaid Other | Admitting: Anesthesiology

## 2012-05-10 ENCOUNTER — Ambulatory Visit (HOSPITAL_COMMUNITY)
Admission: RE | Admit: 2012-05-10 | Discharge: 2012-05-10 | Disposition: A | Discharge status: Home / Self Care | Payer: Medicaid Other | Source: Ambulatory Visit | Attending: General Surgery | Admitting: General Surgery

## 2012-05-10 ENCOUNTER — Encounter (HOSPITAL_COMMUNITY): Payer: Self-pay | Admitting: Anesthesiology

## 2012-05-10 ENCOUNTER — Encounter (HOSPITAL_COMMUNITY)
Admission: RE | Disposition: A | Discharge status: Home / Self Care | Payer: Self-pay | Source: Ambulatory Visit | Attending: General Surgery

## 2012-05-10 ENCOUNTER — Encounter (HOSPITAL_COMMUNITY): Payer: Self-pay | Admitting: *Deleted

## 2012-05-10 DIAGNOSIS — K8020 Calculus of gallbladder without cholecystitis without obstruction: Secondary | ICD-10-CM

## 2012-05-10 HISTORY — PX: CHOLECYSTECTOMY: SHX55

## 2012-05-10 SURGERY — LAPAROSCOPIC CHOLECYSTECTOMY
Anesthesia: General | Site: Abdomen | Wound class: Clean Contaminated

## 2012-05-10 MED ORDER — MIDAZOLAM HCL 2 MG/2ML IJ SOLN
INTRAMUSCULAR | Status: AC
  Filled 2012-05-10: qty 2

## 2012-05-10 MED ORDER — ONDANSETRON HCL 4 MG/2ML IJ SOLN
4.0000 mg | Freq: Once | INTRAMUSCULAR | Status: AC
  Administered 2012-05-10: 4 mg via INTRAVENOUS

## 2012-05-10 MED ORDER — BUPIVACAINE HCL (PF) 0.5 % IJ SOLN
INTRAMUSCULAR | Status: AC
  Filled 2012-05-10: qty 30

## 2012-05-10 MED ORDER — LACTATED RINGERS IV SOLN
INTRAVENOUS | Status: DC | PRN
  Administered 2012-05-10 (×2): via INTRAVENOUS

## 2012-05-10 MED ORDER — ONDANSETRON HCL 4 MG/2ML IJ SOLN
INTRAMUSCULAR | Status: AC
  Filled 2012-05-10: qty 2

## 2012-05-10 MED ORDER — ROCURONIUM BROMIDE 100 MG/10ML IV SOLN
INTRAVENOUS | Status: DC | PRN
  Administered 2012-05-10: 35 mg via INTRAVENOUS

## 2012-05-10 MED ORDER — GLYCOPYRROLATE 0.2 MG/ML IJ SOLN
INTRAMUSCULAR | Status: AC
  Filled 2012-05-10: qty 3

## 2012-05-10 MED ORDER — NEOSTIGMINE METHYLSULFATE 1 MG/ML IJ SOLN
INTRAMUSCULAR | Status: DC | PRN
  Administered 2012-05-10: 4 mg via INTRAVENOUS

## 2012-05-10 MED ORDER — MIDAZOLAM HCL 2 MG/2ML IJ SOLN
1.0000 mg | INTRAMUSCULAR | Status: DC | PRN
  Administered 2012-05-10: 2 mg via INTRAVENOUS

## 2012-05-10 MED ORDER — LACTATED RINGERS IV SOLN
INTRAVENOUS | Status: DC
  Administered 2012-05-10: 1000 mL via INTRAVENOUS

## 2012-05-10 MED ORDER — ENOXAPARIN SODIUM 40 MG/0.4ML ~~LOC~~ SOLN
40.0000 mg | Freq: Once | SUBCUTANEOUS | Status: AC
  Administered 2012-05-10: 40 mg via SUBCUTANEOUS

## 2012-05-10 MED ORDER — FENTANYL CITRATE 0.05 MG/ML IJ SOLN
INTRAMUSCULAR | Status: AC
  Filled 2012-05-10: qty 5

## 2012-05-10 MED ORDER — FENTANYL CITRATE 0.05 MG/ML IJ SOLN
INTRAMUSCULAR | Status: AC
  Filled 2012-05-10: qty 2

## 2012-05-10 MED ORDER — CEFAZOLIN SODIUM 1-5 GM-% IV SOLN
INTRAVENOUS | Status: DC | PRN
  Administered 2012-05-10: 2 g via INTRAVENOUS

## 2012-05-10 MED ORDER — ENOXAPARIN SODIUM 40 MG/0.4ML ~~LOC~~ SOLN
SUBCUTANEOUS | Status: AC
  Filled 2012-05-10: qty 0.4

## 2012-05-10 MED ORDER — GLYCOPYRROLATE 0.2 MG/ML IJ SOLN
INTRAMUSCULAR | Status: DC | PRN
  Administered 2012-05-10: 0.6 mg via INTRAVENOUS

## 2012-05-10 MED ORDER — FENTANYL CITRATE 0.05 MG/ML IJ SOLN
25.0000 ug | INTRAMUSCULAR | Status: DC | PRN
  Administered 2012-05-10 (×4): 50 ug via INTRAVENOUS

## 2012-05-10 MED ORDER — LIDOCAINE HCL (PF) 1 % IJ SOLN
INTRAMUSCULAR | Status: AC
  Filled 2012-05-10: qty 5

## 2012-05-10 MED ORDER — GLYCOPYRROLATE 0.2 MG/ML IJ SOLN
INTRAMUSCULAR | Status: AC
  Filled 2012-05-10: qty 1

## 2012-05-10 MED ORDER — ONDANSETRON HCL 4 MG/2ML IJ SOLN
4.0000 mg | Freq: Once | INTRAMUSCULAR | Status: AC | PRN
  Administered 2012-05-10: 4 mg via INTRAVENOUS

## 2012-05-10 MED ORDER — HYDROCODONE-ACETAMINOPHEN 5-325 MG PO TABS: 1.0000 | ORAL_TABLET | ORAL | Status: AC | PRN

## 2012-05-10 MED ORDER — CELECOXIB 100 MG PO CAPS
ORAL_CAPSULE | ORAL | Status: AC
  Filled 2012-05-10: qty 4

## 2012-05-10 MED ORDER — BUPIVACAINE HCL (PF) 0.5 % IJ SOLN
INTRAMUSCULAR | Status: DC | PRN
  Administered 2012-05-10: 10 mL

## 2012-05-10 MED ORDER — CEFAZOLIN SODIUM-DEXTROSE 2-3 GM-% IV SOLR: 2.0000 g | INTRAVENOUS | Status: DC

## 2012-05-10 MED ORDER — FENTANYL CITRATE 0.05 MG/ML IJ SOLN
INTRAMUSCULAR | Status: DC | PRN
  Administered 2012-05-10: 50 ug via INTRAVENOUS
  Administered 2012-05-10: 100 ug via INTRAVENOUS
  Administered 2012-05-10 (×4): 50 ug via INTRAVENOUS

## 2012-05-10 MED ORDER — SODIUM CHLORIDE 0.9 % IJ SOLN
INTRAMUSCULAR | Status: AC
  Filled 2012-05-10: qty 20

## 2012-05-10 MED ORDER — NEOSTIGMINE METHYLSULFATE 1 MG/ML IJ SOLN
INTRAMUSCULAR | Status: AC
  Filled 2012-05-10: qty 10

## 2012-05-10 MED ORDER — GLYCOPYRROLATE 0.2 MG/ML IJ SOLN
0.2000 mg | Freq: Once | INTRAMUSCULAR | Status: AC
  Administered 2012-05-10: 0.2 mg via INTRAVENOUS

## 2012-05-10 MED ORDER — LIDOCAINE HCL (CARDIAC) 10 MG/ML IV SOLN
INTRAVENOUS | Status: DC | PRN
  Administered 2012-05-10: 50 mg via INTRAVENOUS

## 2012-05-10 MED ORDER — PROPOFOL 10 MG/ML IV EMUL
INTRAVENOUS | Status: AC
Start: 2012-05-10 — End: 2012-05-10
  Filled 2012-05-10: qty 20

## 2012-05-10 MED ORDER — PROPOFOL 10 MG/ML IV EMUL
INTRAVENOUS | Status: DC | PRN
  Administered 2012-05-10: 180 mg via INTRAVENOUS

## 2012-05-10 MED ORDER — ROCURONIUM BROMIDE 50 MG/5ML IV SOLN
INTRAVENOUS | Status: AC
  Filled 2012-05-10: qty 1

## 2012-05-10 MED ORDER — SODIUM CHLORIDE 0.9 % IR SOLN
Status: DC | PRN
  Administered 2012-05-10: 1000 mL

## 2012-05-10 MED ORDER — CELECOXIB 100 MG PO CAPS
400.0000 mg | ORAL_CAPSULE | Freq: Every day | ORAL | Status: AC
  Administered 2012-05-10: 400 mg via ORAL

## 2012-05-10 MED ORDER — CEFAZOLIN SODIUM-DEXTROSE 2-3 GM-% IV SOLR
INTRAVENOUS | Status: AC
  Filled 2012-05-10: qty 50

## 2012-05-10 SURGICAL SUPPLY — 43 items
APL SKNCLS STERI-STRIP NONHPOA (GAUZE/BANDAGES/DRESSINGS) ×1
APPLIER CLIP UNV 5X34 EPIX (ENDOMECHANICALS) ×2 IMPLANT
APR XCLPCLP 20M/L UNV 34X5 (ENDOMECHANICALS) ×1
BAG HAMPER (MISCELLANEOUS) ×2 IMPLANT
BAG SPEC RTRVL LRG 6X4 10 (ENDOMECHANICALS) ×1
BENZOIN TINCTURE PRP APPL 2/3 (GAUZE/BANDAGES/DRESSINGS) ×2 IMPLANT
CLOTH BEACON ORANGE TIMEOUT ST (SAFETY) ×2 IMPLANT
COVER LIGHT HANDLE STERIS (MISCELLANEOUS) ×4 IMPLANT
DECANTER SPIKE VIAL GLASS SM (MISCELLANEOUS) ×2 IMPLANT
DEVICE TROCAR PUNCTURE CLOSURE (ENDOMECHANICALS) ×2 IMPLANT
DURAPREP 26ML APPLICATOR (WOUND CARE) ×2 IMPLANT
ELECT REM PT RETURN 9FT ADLT (ELECTROSURGICAL) ×2
ELECTRODE REM PT RTRN 9FT ADLT (ELECTROSURGICAL) ×1 IMPLANT
FILTER SMOKE EVAC LAPAROSHD (FILTER) ×2 IMPLANT
FORMALIN 10 PREFIL 120ML (MISCELLANEOUS) ×2 IMPLANT
GLOVE BIOGEL PI IND STRL 7.0 (GLOVE) IMPLANT
GLOVE BIOGEL PI IND STRL 7.5 (GLOVE) ×1 IMPLANT
GLOVE BIOGEL PI INDICATOR 7.0 (GLOVE) ×2
GLOVE BIOGEL PI INDICATOR 7.5 (GLOVE) ×1
GLOVE ECLIPSE 6.5 STRL STRAW (GLOVE) ×2 IMPLANT
GLOVE ECLIPSE 7.0 STRL STRAW (GLOVE) ×2 IMPLANT
GOWN STRL REIN XL XLG (GOWN DISPOSABLE) ×6 IMPLANT
HEMOSTAT SNOW SURGICEL 2X4 (HEMOSTASIS) ×1 IMPLANT
INST SET LAPROSCOPIC AP (KITS) ×2 IMPLANT
IV NS IRRIG 3000ML ARTHROMATIC (IV SOLUTION) ×2 IMPLANT
KIT ROOM TURNOVER APOR (KITS) ×2 IMPLANT
MANIFOLD NEPTUNE II (INSTRUMENTS) ×2 IMPLANT
NDL INSUFFLATION 14GA 120MM (NEEDLE) ×1 IMPLANT
NEEDLE INSUFFLATION 14GA 120MM (NEEDLE) ×2 IMPLANT
PACK LAP CHOLE LZT030E (CUSTOM PROCEDURE TRAY) ×2 IMPLANT
PAD ARMBOARD 7.5X6 YLW CONV (MISCELLANEOUS) ×2 IMPLANT
POUCH SPECIMEN RETRIEVAL 10MM (ENDOMECHANICALS) ×2 IMPLANT
SET BASIN LINEN APH (SET/KITS/TRAYS/PACK) ×2 IMPLANT
SET TUBE IRRIG SUCTION NO TIP (IRRIGATION / IRRIGATOR) IMPLANT
SLEEVE Z-THREAD 5X100MM (TROCAR) ×4 IMPLANT
SPONGE GAUZE 2X2 8PLY STRL LF (GAUZE/BANDAGES/DRESSINGS) ×4 IMPLANT
STRIP CLOSURE SKIN 1/2X4 (GAUZE/BANDAGES/DRESSINGS) ×4 IMPLANT
SUT MNCRL AB 4-0 PS2 18 (SUTURE) ×4 IMPLANT
SUT VIC AB 2-0 CT2 27 (SUTURE) ×2 IMPLANT
TAPE CLOTH SURG 4X10 WHT LF (GAUZE/BANDAGES/DRESSINGS) ×1 IMPLANT
TROCAR Z-THRD FIOS HNDL 11X100 (TROCAR) ×2 IMPLANT
TROCAR Z-THREAD FIOS 5X100MM (TROCAR) ×2 IMPLANT
WARMER LAPAROSCOPE (MISCELLANEOUS) ×2 IMPLANT

## 2012-05-10 NOTE — Anesthesia Procedure Notes (Signed)
Procedure Name: Intubation Date/Time: 05/10/2012 9:06 AM Performed by: Carolyne Littles, Siddhanth Denk L Pre-anesthesia Checklist: Patient identified, Patient being monitored, Timeout performed, Emergency Drugs available and Suction available Patient Re-evaluated:Patient Re-evaluated prior to inductionOxygen Delivery Method: Circle System Utilized Preoxygenation: Pre-oxygenation with 100% oxygen Intubation Type: IV induction Ventilation: Mask ventilation without difficulty Laryngoscope Size: 3 and Miller Grade View: Grade I Tube type: Oral Tube size: 7.0 mm Number of attempts: 1 Airway Equipment and Method: stylet Placement Confirmation: ETT inserted through vocal cords under direct vision,  positive ETCO2 and breath sounds checked- equal and bilateral Secured at: 21 cm Tube secured with: Tape Dental Injury: Teeth and Oropharynx as per pre-operative assessment

## 2012-05-10 NOTE — Anesthesia Postprocedure Evaluation (Signed)
  Anesthesia Post-op Note  Patient: Olivia Small  Procedure(s) Performed: Procedure(s) (LRB): LAPAROSCOPIC CHOLECYSTECTOMY (N/A)  Patient Location: [place]  Anesthesia Type: General  Level of Consciousness: awake, alert , oriented and patient cooperative  Airway and Oxygen Therapy: Patient Spontanous Breathing and Patient connected to face mask oxygen  Post-op Pain: mild  Post-op Assessment: Post-op Vital signs reviewed, Patient's Cardiovascular Status Stable, Respiratory Function Stable, Patent Airway and No signs of Nausea or vomiting  Post-op Vital Signs: Reviewed and stable  Complications: No apparent anesthesia complications

## 2012-05-10 NOTE — Anesthesia Preprocedure Evaluation (Signed)
Anesthesia Evaluation  Patient identified by MRN, date of birth, ID band Patient awake    Reviewed: Allergy & Precautions, H&P , NPO status , Patient's Chart, lab work & pertinent test results  Airway Mallampati: I      Dental  (+) Teeth Intact   Pulmonary neg pulmonary ROS,  breath sounds clear to auscultation        Cardiovascular negative cardio ROS  Rhythm:Regular     Neuro/Psych    GI/Hepatic negative GI ROS,   Endo/Other    Renal/GU      Musculoskeletal   Abdominal   Peds  Hematology   Anesthesia Other Findings   Reproductive/Obstetrics                           Anesthesia Physical Anesthesia Plan  ASA: I  Anesthesia Plan: General   Post-op Pain Management:    Induction: Intravenous  Airway Management Planned: Oral ETT  Additional Equipment:   Intra-op Plan:   Post-operative Plan: Extubation in OR  Informed Consent: I have reviewed the patients History and Physical, chart, labs and discussed the procedure including the risks, benefits and alternatives for the proposed anesthesia with the patient or authorized representative who has indicated his/her understanding and acceptance.     Plan Discussed with:   Anesthesia Plan Comments:         Anesthesia Quick Evaluation

## 2012-05-10 NOTE — Op Note (Signed)
Patient:  Olivia Small  DOB:  [birth year]  MRN:  [id number]   Preop Diagnosis:  Cholelithiasis  Postop Diagnosis:  The same  Procedure:  Laparoscopic cholecystectomy  Surgeon:  Dr. Tilford Pillar  Anes:  General endotracheal, 0.5% Sensorcaine plain  Indications:  Patient is a 26 year old female presented my office with a history of epigastric abdominal pain. Workup and evaluation was consistent for acute cholecystitis cholelithiasis. Risks benefits alternatives of a laparoscopic possible open cholecystectomy were discussed at length patient including but not limited to risk of bleeding, infection, bile leak, small bowel injury, common bile duct injury, intraoperative cardiac and pulmonary events. Patient's questions and concerns were addressed the patient was consented for the planned procedure.  Procedure note:  Patient is taken to the OR space the supine position on the OR table time the general anesthetic is a Optician, dispensing. Once patient was asleep she symmetrically admitted by the nurse anesthetist. At this point her abdomen is prepped with DuraPrep solution and draped in standard fashion. A stab incision was created supraumbilically with 11 blade scalpel with additional dissection down to subcuticular tissue carried out using a Coker clamp which he utilized grasp the anterior normal fascia and lift this upward. A Veress needle is inserted saline drop is utilized confirm intraperitoneal placement and then pneumoperitoneum was initiated. Once sufficient pneumoperitoneum was obtained an 11 mm insert overlap scope allowing visualization the trocar entering into the peritoneal cavity. At this point the remaining trochars replaced a 5 mm can epigastrium, 5 monitored in the midline, and a 5 mm in the right lateral abdominal wall. Patient's placed into a reverse Trendelenburg left lateral decubitus position. The fundus of the gallbladder is grasped and lifted up and over the right lobe the liver. Blunt  peritoneal dissection is carried out using a [place] the stripped the peritoneal reflection off the infundibulum exposing both the cystic duct and cystic artery. A window was created behind the cystic duct, and Precose placed proximally one distally and the cystic duct was divided between 2 most is a clips. Similarly the cystic artery is identified 2 endoclips placed proximally one distally cystic arteries divided between 2 most is a clips. At this point electrocautery utilized dissect the gallbladder free from the gallbladder fossa. Once it is free is placed into an Endo Catch bag and placed up and over the right lobe the liver. To facilitate this the 10 mm scope is exchanged for a 5 mm scope. Inspection of the gallbladder fossa demonstrated excellent hemostasis. The endoclips were inspected there is no evidence of any bleeding or bile leak. At this time attention was turned to closure.  Using Endo Close suture passing device a 2-0 Vicryl sutures passed to the umbilical trocar site. With this suture and placed the gallbladder is retrieved and removed through the umbilical trocar site and intact Endo Catch bag. It is placed in the back table sent as a perm specimen to pathology. At this point the pneumoperitoneum was evacuated. The records removed. The Vicryl sutures secured. The local anesthetic is instilled. A 4-0 Monocryl utilized reapproximate skin edges at all 4 trocar sites. The skin was washed dried moist dry towel. Benzoin is applied around incision. Half-inch are suture placed. The drapes removed patient left come out of general static is transferred back in stable condition. At the conclusion of procedure all instrument, sponge, needle counts are correct. Patient tolerated procedure extremely well.  Complications:  None apparent  EBL:  Minimal  Specimen:  Gallbladder

## 2012-05-10 NOTE — Preoperative (Signed)
Beta Blockers   Reason not to administer Beta Blockers:Not Applicable 

## 2012-05-10 NOTE — H&P (Signed)
Olivia Small is an 26 y.o. female.   Chief Complaint: RUQ pain HPI: Patient presented to my office with a history of right upper quadrant epigastric abdominal pain. Workup and evaluation was consistent with cholelithiasis. Pain is exacerbated fatty greasy foods. She does have some bloating sensation with fatty greasy foods. No history of jaundice. Pain is described as colicky in nature when it occurs. She does have family history of biliary disease. She has had progressing symptomatology over the last several months. No change in bowel movements no melena no hematochezia.  Past Medical History  Diagnosis Date  . 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 No pertinent past medical history     History reviewed. No pertinent past surgical history.  Family History  Problem Relation Age of Onset  . Diabetes Father   . Cancer Sister    Social History:  reports that she has never smoked. She does not have any smokeless tobacco history on file. She reports that she does not drink alcohol or use illicit drugs.  Allergies: No Known Allergies  Medications Prior to Admission  Medication Sig Dispense Refill  . norgestrel-ethinyl estradiol (LO/OVRAL,CRYSELLE) 0.3-30 MG-MCG tablet Take 1 tablet by mouth daily.        Results for orders placed during the hospital encounter of 05/08/12 (from the past 48 hour(s))  SURGICAL PCR SCREEN     Status: Normal   Collection Time   05/08/12  9:25 AM      Component Value Range Comment   MRSA, PCR NEGATIVE  NEGATIVE    Staphylococcus aureus NEGATIVE  NEGATIVE   HCG, QUANTITATIVE, PREGNANCY     Status: Normal   Collection Time   05/08/12  9:25 AM      Component Value Range Comment   hCG, Beta Chain, Quant, S <1  <5 mIU/mL   BASIC METABOLIC PANEL     Status: Normal   Collection Time   05/08/12  9:25 AM      Component Value Range Comment   Sodium 137  135 - 145 mEq/L    Potassium 4.0  3.5 - 5.1 mEq/L    Chloride 103  96 - 112 mEq/L    CO2 26  19 - 32 mEq/L    Glucose, Bld 88  70 - 99 mg/dL    BUN 14  6 - 23 mg/dL    Creatinine, Ser 1.61  0.50 - 1.10 mg/dL    Calcium 9.9  8.4 - 09.6 mg/dL    GFR calc non Af Amer >90  >90 mL/min    GFR calc Af Amer >90  >90 mL/min   CBC WITH DIFFERENTIAL     Status: Normal   Collection Time   05/08/12  9:25 AM      Component Value Range Comment   WBC 5.1  4.0 - 10.5 K/uL    RBC 4.01  3.87 - 5.11 MIL/uL    Hemoglobin 12.9  12.0 - 15.0 g/dL    HCT 04.5  40.9 - 81.1 %    MCV 92.0  78.0 - 100.0 fL    MCH 32.2  26.0 - 34.0 pg    MCHC 35.0  30.0 - 36.0 g/dL    RDW 91.4  78.2 - 95.6 %    Platelets 190  150 - 400 K/uL    Neutrophils Relative 54  43 - 77 %    Neutro Abs 2.8  1.7 -  7.7 K/uL    Lymphocytes Relative 39  12 - 46 %    Lymphs Abs 2.0  0.7 - 4.0 K/uL    Monocytes Relative 6  3 - 12 %    Monocytes Absolute 0.3  0.1 - 1.0 K/uL    Eosinophils Relative 1  0 - 5 %    Eosinophils Absolute 0.1  0.0 - 0.7 K/uL    Basophils Relative 0  0 - 1 %    Basophils Absolute 0.0  0.0 - 0.1 K/uL    No results found.  Review of Systems  Constitutional: Negative.   HENT: Negative.   Eyes: Negative.   Respiratory: Negative.   Cardiovascular: Negative.   Gastrointestinal: Positive for heartburn, nausea and abdominal pain (RUQ). Negative for diarrhea, constipation, blood in stool and melena.  Genitourinary: Negative.   Musculoskeletal: Negative.   Skin: Negative.   Neurological: Negative.   Endo/Heme/Allergies: Negative.   Psychiatric/Behavioral: Negative.     Blood pressure 120/76, pulse 76, temperature 98.2 F (36.8 C), temperature source Oral, resp. rate 15, last menstrual period 03/28/2012, SpO2 94.00%, not currently breastfeeding. Physical Exam  Constitutional: She is oriented to person, place, and time. She appears well-developed and well-nourished. No distress.       Obese  HENT:  Head: Normocephalic and atraumatic.  Eyes: Conjunctivae are  normal. Pupils are equal, round, and reactive to light. No scleral icterus.  Neck: Normal range of motion. Neck supple. No tracheal deviation present. No thyromegaly present.  Cardiovascular: Normal rate, regular rhythm and normal heart sounds.   Respiratory: Effort normal and breath sounds normal. No respiratory distress. She has no wheezes.  GI: Soft. Bowel sounds are normal. She exhibits no distension and no mass. There is no tenderness. There is no rebound and no guarding.  Musculoskeletal: Normal range of motion.  Lymphadenopathy:    She has no cervical adenopathy.  Neurological: She is alert and oriented to person, place, and time.  Skin: Skin is warm and dry.     Assessment/Plan Cholelithiasis. Patient will be consented and scheduled for a laparoscopic possible open cholecystectomy at her earliest convenience. Risks benefits alternatives a laparoscopic possible open cholecystectomy were discussed at length patient including but not limited to risk of bleeding, infection, bile leak, small bowel, common bile duct injury, intraoperative cardiac and pulmonary events. Questions and concerns were addressed the patient will be consented at her convenience.  Olivia Small C 05/10/2012, 8:53 AM

## 2012-05-10 NOTE — Interval H&P Note (Signed)
History and Physical Interval Note:  05/10/2012 8:55 AM  Olivia Small  has presented today for surgery, with the diagnosis of Cholelithiasis  The various methods of treatment have been discussed with the patient and family. After consideration of risks, benefits and other options for treatment, the patient has consented to  Procedure(s) (LRB): LAPAROSCOPIC CHOLECYSTECTOMY (N/A) as a surgical intervention .  The patient's history has been reviewed, patient examined, no change in status, stable for surgery.  I have reviewed the patient's chart and labs.  Questions were answered to the patient's satisfaction.     [name]

## 2012-05-10 NOTE — Transfer of Care (Signed)
Immediate Anesthesia Transfer of Care Note  Patient: Olivia Small  Procedure(s) Performed: Procedure(s) (LRB): LAPAROSCOPIC CHOLECYSTECTOMY (N/A)  Patient Location: PACU  Anesthesia Type: General  Level of Consciousness: awake, alert , oriented and patient cooperative  Airway & Oxygen Therapy: Patient Spontanous Breathing and Patient connected to face mask oxygen  Post-op Assessment: Report given to PACU RN and Post -op Vital signs reviewed and stable  Post vital signs: Reviewed and stable  Complications: No apparent anesthesia complications

## 2012-05-11 NOTE — Addendum Note (Signed)
Addendum  created 05/11/12 1006 by Baptiste Littler J Blaine Guiffre, CRNA   Modules edited:Notes Section    

## 2012-05-11 NOTE — Anesthesia Postprocedure Evaluation (Signed)
  Anesthesia Post-op Note  Patient: Olivia Small  Procedure(s) Performed: Procedure(s) (LRB): LAPAROSCOPIC CHOLECYSTECTOMY (N/A)  Patient Location: 3A  Anesthesia Type: General  Level of Consciousness: awake, alert , oriented and patient cooperative  Airway and Oxygen Therapy: Patient Spontanous Breathing  Post-op Pain: none  Post-op Assessment: Post-op Vital signs reviewed, Patient's Cardiovascular Status Stable, Respiratory Function Stable, Patent Airway, No signs of Nausea or vomiting, Adequate PO intake and Pain level controlled  Post-op Vital Signs: Reviewed and stable  Complications: No apparent anesthesia complications

## 2012-05-16 ENCOUNTER — Encounter (HOSPITAL_COMMUNITY): Payer: Self-pay | Admitting: General Surgery

## 2012-07-10 LAB — OB RESULTS CONSOLE ABO/RH

## 2012-07-10 LAB — OB RESULTS CONSOLE VARICELLA ZOSTER ANTIBODY, IGG: Varicella: IMMUNE

## 2012-07-10 LAB — OB RESULTS CONSOLE RPR: RPR: NONREACTIVE

## 2012-07-10 LAB — OB RESULTS CONSOLE HIV ANTIBODY (ROUTINE TESTING): HIV: NONREACTIVE

## 2012-07-10 LAB — OB RESULTS CONSOLE GC/CHLAMYDIA: Chlamydia: NEGATIVE

## 2012-09-27 NOTE — L&D Delivery Note (Signed)
Delivery Note At 11:51 PM a viable female was delivered via  (Presentation: ; Occiput Anterior).  APGAR: 8, 9; weight .   Placenta status: Intact, Spontaneous.  Cord: 3 vessels with the following complications: None.  Cord pH: N/A  Anesthesia: Epidural  Episiotomy: None Lacerations: None Est. Blood Loss (mL): 300  Mom to postpartum.  Baby to nursery-stable.  [name] Other 02/17/2013, 12:34 AM  Attended delivery Agree with note Wynelle Bourgeois CNM

## 2012-09-27 NOTE — L&D Delivery Note (Signed)
Attestation of Attending Supervision of Advanced Practitioner (CNM/NP): Evaluation and management procedures were performed by the Advanced Practitioner under my supervision and collaboration.  I have reviewed the Advanced Practitioner's note and chart, and I agree with the management and plan.  Kingjames Coury 02/21/2013 8:58 AM

## 2012-11-29 ENCOUNTER — Encounter: Payer: Self-pay | Admitting: *Deleted

## 2012-12-20 ENCOUNTER — Ambulatory Visit (INDEPENDENT_AMBULATORY_CARE_PROVIDER_SITE_OTHER): Payer: Medicaid Other | Admitting: Advanced Practice Midwife

## 2012-12-20 ENCOUNTER — Other Ambulatory Visit: Payer: Medicaid Other

## 2012-12-20 ENCOUNTER — Encounter: Payer: Self-pay | Admitting: Advanced Practice Midwife

## 2012-12-20 VITALS — BP 110/70 | Wt 235.0 lb

## 2012-12-20 DIAGNOSIS — Z331 Pregnant state, incidental: Secondary | ICD-10-CM

## 2012-12-20 DIAGNOSIS — O99210 Obesity complicating pregnancy, unspecified trimester: Secondary | ICD-10-CM

## 2012-12-20 DIAGNOSIS — O98519 Other viral diseases complicating pregnancy, unspecified trimester: Secondary | ICD-10-CM

## 2012-12-20 DIAGNOSIS — Z3482 Encounter for supervision of other normal pregnancy, second trimester: Secondary | ICD-10-CM

## 2012-12-20 DIAGNOSIS — O99019 Anemia complicating pregnancy, unspecified trimester: Secondary | ICD-10-CM

## 2012-12-20 DIAGNOSIS — Z3490 Encounter for supervision of normal pregnancy, unspecified, unspecified trimester: Secondary | ICD-10-CM

## 2012-12-20 DIAGNOSIS — O360990 Maternal care for other rhesus isoimmunization, unspecified trimester, not applicable or unspecified: Secondary | ICD-10-CM

## 2012-12-20 DIAGNOSIS — B009 Herpesviral infection, unspecified: Secondary | ICD-10-CM | POA: Insufficient documentation

## 2012-12-20 DIAGNOSIS — Z1389 Encounter for screening for other disorder: Secondary | ICD-10-CM

## 2012-12-20 LAB — POCT URINALYSIS DIPSTICK
Blood, UA: NEGATIVE
Glucose, UA: NEGATIVE

## 2012-12-20 LAB — CBC
HCT: 34.7 % — ABNORMAL LOW (ref 36.0–46.0)
MCHC: 34.3 g/dL (ref 30.0–36.0)
Platelets: 143 10*3/uL — ABNORMAL LOW (ref 150–400)
RDW: 13.2 % (ref 11.5–15.5)
WBC: 8.1 10*3/uL (ref 4.0–10.5)

## 2012-12-20 NOTE — Progress Notes (Signed)
Doing PN2 today.  BTL papers signed.    No c/o at this time.  Routine questions about pregnancy andswered.  F/U in 2 weeks for recheck nasal bone U/S.

## 2012-12-21 LAB — HIV ANTIBODY (ROUTINE TESTING W REFLEX): HIV: NONREACTIVE

## 2012-12-21 LAB — RPR

## 2012-12-21 LAB — GLUCOSE TOLERANCE, 2 HOURS W/ 1HR: Glucose, Fasting: 76 mg/dL (ref 70–99)

## 2012-12-21 LAB — HSV 2 ANTIBODY, IGG: HSV 2 Glycoprotein G Ab, IgG: 11.31 IV — ABNORMAL HIGH

## 2013-01-03 ENCOUNTER — Other Ambulatory Visit: Payer: Medicaid Other

## 2013-01-03 ENCOUNTER — Encounter: Payer: Medicaid Other | Admitting: Obstetrics and Gynecology

## 2013-01-08 ENCOUNTER — Other Ambulatory Visit: Payer: Medicaid Other

## 2013-01-08 ENCOUNTER — Ambulatory Visit (INDEPENDENT_AMBULATORY_CARE_PROVIDER_SITE_OTHER): Payer: Medicaid Other

## 2013-01-08 ENCOUNTER — Encounter: Payer: Medicaid Other | Admitting: Obstetrics & Gynecology

## 2013-01-08 ENCOUNTER — Ambulatory Visit (INDEPENDENT_AMBULATORY_CARE_PROVIDER_SITE_OTHER): Payer: Medicaid Other | Admitting: Obstetrics & Gynecology

## 2013-01-08 VITALS — BP 112/70 | Wt 236.5 lb

## 2013-01-08 DIAGNOSIS — O360990 Maternal care for other rhesus isoimmunization, unspecified trimester, not applicable or unspecified: Secondary | ICD-10-CM

## 2013-01-08 DIAGNOSIS — Z3480 Encounter for supervision of other normal pregnancy, unspecified trimester: Secondary | ICD-10-CM

## 2013-01-08 DIAGNOSIS — O358XX0 Maternal care for other (suspected) fetal abnormality and damage, not applicable or unspecified: Secondary | ICD-10-CM

## 2013-01-08 DIAGNOSIS — Z331 Pregnant state, incidental: Secondary | ICD-10-CM

## 2013-01-08 DIAGNOSIS — Z1389 Encounter for screening for other disorder: Secondary | ICD-10-CM

## 2013-01-08 DIAGNOSIS — O98519 Other viral diseases complicating pregnancy, unspecified trimester: Secondary | ICD-10-CM

## 2013-01-08 DIAGNOSIS — Z3490 Encounter for supervision of normal pregnancy, unspecified, unspecified trimester: Secondary | ICD-10-CM

## 2013-01-08 DIAGNOSIS — B009 Herpesviral infection, unspecified: Secondary | ICD-10-CM

## 2013-01-08 DIAGNOSIS — Z3483 Encounter for supervision of other normal pregnancy, third trimester: Secondary | ICD-10-CM

## 2013-01-08 DIAGNOSIS — E669 Obesity, unspecified: Secondary | ICD-10-CM

## 2013-01-08 LAB — POCT URINALYSIS DIPSTICK
Blood, UA: NEGATIVE
Glucose, UA: NEGATIVE

## 2013-01-08 MED ORDER — ACYCLOVIR 400 MG PO TABS: 400.0000 mg | ORAL_TABLET | Freq: Three times a day (TID) | ORAL | Status: DC

## 2013-01-08 NOTE — Patient Instructions (Signed)
Pregnancy - Third Trimester  The third trimester of pregnancy (the last 3 months) is a period of the most rapid growth for you and your baby. The baby approaches a length of 20 inches and a weight of 6 to 10 pounds. The baby is adding on fat and getting ready for life outside your body. While inside, babies have periods of sleeping and waking, suck their thumbs, and hiccups. You can often feel small contractions of the uterus. This is false labor. It is also called Braxton-Hicks contractions. This is like a practice for labor. The usual problems in this stage of pregnancy include more difficulty breathing, swelling of the hands and feet from water retention, and having to urinate more often because of the uterus and baby pressing on your bladder.   PRENATAL EXAMS  · Blood work may continue to be done during prenatal exams. These tests are done to check on your health and the probable health of your baby. Blood work is used to follow your blood levels (hemoglobin). Anemia (low hemoglobin) is common during pregnancy. Iron and vitamins are given to help prevent this. You may also continue to be checked for diabetes. Some of the past blood tests may be done again.  · The size of the uterus is measured during each visit. This makes sure your baby is growing properly according to your pregnancy dates.  · Your blood pressure is checked every prenatal visit. This is to make sure you are not getting toxemia.  · Your urine is checked every prenatal visit for infection, diabetes and protein.  · Your weight is checked at each visit. This is done to make sure gains are happening at the suggested rate and that you and your baby are growing normally.  · Sometimes, an ultrasound is performed to confirm the position and the proper growth and development of the baby. This is a test done that bounces harmless sound waves off the baby so your caregiver can more accurately determine due dates.  · Discuss the type of pain medication and  anesthesia you will have during your labor and delivery.  · Discuss the possibility and anesthesia if a Cesarean Section might be necessary.  · Inform your caregiver if there is any mental or physical violence at home.  Sometimes, a specialized non-stress test, contraction stress test and biophysical profile are done to make sure the baby is not having a problem. Checking the amniotic fluid surrounding the baby is called an amniocentesis. The amniotic fluid is removed by sticking a needle into the belly (abdomen). This is sometimes done near the end of pregnancy if an early delivery is required. In this case, it is done to help make sure the baby's lungs are mature enough for the baby to live outside of the womb. If the lungs are not mature and it is unsafe to deliver the baby, an injection of cortisone medication is given to the mother 1 to 2 days before the delivery. This helps the baby's lungs mature and makes it safer to deliver the baby.  CHANGES OCCURING IN THE THIRD TRIMESTER OF PREGNANCY  Your body goes through many changes during pregnancy. They vary from person to person. Talk to your caregiver about changes you notice and are concerned about.  · During the last trimester, you have probably had an increase in your appetite. It is normal to have cravings for certain foods. This varies from person to person and pregnancy to pregnancy.  · You may begin to   get stretch marks on your hips, abdomen, and breasts. These are normal changes in the body during pregnancy. There are no exercises or medications to take which prevent this change.  · Constipation may be treated with a stool softener or adding bulk to your diet. Drinking lots of fluids, fiber in vegetables, fruits, and whole grains are helpful.  · Exercising is also helpful. If you have been very active up until your pregnancy, most of these activities can be continued during your pregnancy. If you have been less active, it is helpful to start an exercise  program such as walking. Consult your caregiver before starting exercise programs.  · Avoid all smoking, alcohol, un-prescribed drugs, herbs and "street drugs" during your pregnancy. These chemicals affect the formation and growth of the baby. Avoid chemicals throughout the pregnancy to ensure the delivery of a healthy infant.  · Backache, varicose veins and hemorrhoids may develop or get worse.  · You will tire more easily in the third trimester, which is normal.  · The baby's movements may be stronger and more often.  · You may become short of breath easily.  · Your belly button may stick out.  · A yellow discharge may leak from your breasts called colostrum.  · You may have a bloody mucus discharge. This usually occurs a few days to a week before labor begins.  HOME CARE INSTRUCTIONS   · Keep your caregiver's appointments. Follow your caregiver's instructions regarding medication use, exercise, and diet.  · During pregnancy, you are providing food for you and your baby. Continue to eat regular, well-balanced meals. Choose foods such as meat, fish, milk and other low fat dairy products, vegetables, fruits, and whole-grain breads and cereals. Your caregiver will tell you of the ideal weight gain.  · A physical sexual relationship may be continued throughout pregnancy if there are no other problems such as early (premature) leaking of amniotic fluid from the membranes, vaginal bleeding, or belly (abdominal) pain.  · Exercise regularly if there are no restrictions. Check with your caregiver if you are unsure of the safety of your exercises. Greater weight gain will occur in the last 2 trimesters of pregnancy. Exercising helps:  · Control your weight.  · Get you in shape for labor and delivery.  · You lose weight after you deliver.  · Rest a lot with legs elevated, or as needed for leg cramps or low back pain.  · Wear a good support or jogging bra for breast tenderness during pregnancy. This may help if worn during  sleep. Pads or tissues may be used in the bra if you are leaking colostrum.  · Do not use hot tubs, steam rooms, or saunas.  · Wear your seat belt when driving. This protects you and your baby if you are in an accident.  · Avoid raw meat, cat litter boxes and soil used by cats. These carry germs that can cause birth defects in the baby.  · It is easier to loose urine during pregnancy. Tightening up and strengthening the pelvic muscles will help with this problem. You can practice stopping your urination while you are going to the bathroom. These are the same muscles you need to strengthen. It is also the muscles you would use if you were trying to stop from passing gas. You can practice tightening these muscles up 10 times a set and repeating this about 3 times per day. Once you know what muscles to tighten up, do not perform these   exercises during urination. It is more likely to cause an infection by backing up the urine.  · Ask for help if you have financial, counseling or nutritional needs during pregnancy. Your caregiver will be able to offer counseling for these needs as well as refer you for other special needs.  · Make a list of emergency phone numbers and have them available.  · Plan on getting help from family or friends when you go home from the hospital.  · Make a trial run to the hospital.  · Take prenatal classes with the father to understand, practice and ask questions about the labor and delivery.  · Prepare the baby's room/nursery.  · Do not travel out of the city unless it is absolutely necessary and with the advice of your caregiver.  · Wear only low or no heal shoes to have better balance and prevent falling.  MEDICATIONS AND DRUG USE IN PREGNANCY  · Take prenatal vitamins as directed. The vitamin should contain 1 milligram of folic acid. Keep all vitamins out of reach of children. Only a couple vitamins or tablets containing iron may be fatal to a baby or young child when ingested.  · Avoid use  of all medications, including herbs, over-the-counter medications, not prescribed or suggested by your caregiver. Only take over-the-counter or prescription medicines for pain, discomfort, or fever as directed by your caregiver. Do not use aspirin, ibuprofen (Motrin®, Advil®, Nuprin®) or naproxen (Aleve®) unless OK'd by your caregiver.  · Let your caregiver also know about herbs you may be using.  · Alcohol is related to a number of birth defects. This includes fetal alcohol syndrome. All alcohol, in any form, should be avoided completely. Smoking will cause low birth rate and premature babies.  · Street/illegal drugs are very harmful to the baby. They are absolutely forbidden. A baby born to an addicted mother will be addicted at birth. The baby will go through the same withdrawal an adult does.  SEEK MEDICAL CARE IF:  You have any concerns or worries during your pregnancy. It is better to call with your questions if you feel they cannot wait, rather than worry about them.  DECISIONS ABOUT CIRCUMCISION  You may or may not know the sex of your baby. If you know your baby is a boy, it may be time to think about circumcision. Circumcision is the removal of the foreskin of the penis. This is the skin that covers the sensitive end of the penis. There is no proven medical need for this. Often this decision is made on what is popular at the time or based upon religious beliefs and social issues. You can discuss these issues with your caregiver or pediatrician.  SEEK IMMEDIATE MEDICAL CARE IF:   · An unexplained oral temperature above 102° F (38.9° C) develops, or as your caregiver suggests.  · You have leaking of fluid from the vagina (birth canal). If leaking membranes are suspected, take your temperature and tell your caregiver of this when you call.  · There is vaginal spotting, bleeding or passing clots. Tell your caregiver of the amount and how many pads are used.  · You develop a bad smelling vaginal discharge with  a change in the color from clear to white.  · You develop vomiting that lasts more than 24 hours.  · You develop chills or fever.  · You develop shortness of breath.  · You develop burning on urination.  · You loose more than 2 pounds of weight   or gain more than 2 pounds of weight or as suggested by your caregiver.  · You notice sudden swelling of your face, hands, and feet or legs.  · You develop belly (abdominal) pain. Round ligament discomfort is a common non-cancerous (benign) cause of abdominal pain in pregnancy. Your caregiver still must evaluate you.  · You develop a severe headache that does not go away.  · You develop visual problems, blurred or double vision.  · If you have not felt your baby move for more than 1 hour. If you think the baby is not moving as much as usual, eat something with sugar in it and lie down on your left side for an hour. The baby should move at least 4 to 5 times per hour. Call right away if your baby moves less than that.  · You fall, are in a car accident or any kind of trauma.  · There is mental or physical violence at home.  Document Released: 09/07/2001 Document Revised: 12/06/2011 Document Reviewed: 03/12/2009  [employer]® Patient Information ©2013 [employer].

## 2013-01-08 NOTE — Progress Notes (Signed)
BP weight and urine results all reviewed and noted. Patient reports good fetal movement, denies any bleeding and no rupture of membranes symptoms or regular contractions. Patient is without complaints. All questions were answered. Sonogram noted and reviewed in full all questions answered.

## 2013-01-08 NOTE — Progress Notes (Signed)
U/S-34+[id number] active fetus, fluid wnl AFI=12.3cm, ant gr 1 plac, EFW 5 lb 3 oz (2364 gms 35th%tile), NB present, profile noted today

## 2013-01-09 LAB — US OB FOLLOW UP

## 2013-01-23 ENCOUNTER — Ambulatory Visit (INDEPENDENT_AMBULATORY_CARE_PROVIDER_SITE_OTHER): Payer: Medicaid Other | Admitting: Obstetrics & Gynecology

## 2013-01-23 ENCOUNTER — Encounter: Payer: Self-pay | Admitting: Obstetrics & Gynecology

## 2013-01-23 VITALS — BP 110/60 | Wt 238.0 lb

## 2013-01-23 DIAGNOSIS — IMO0002 Reserved for concepts with insufficient information to code with codable children: Secondary | ICD-10-CM

## 2013-01-23 DIAGNOSIS — E669 Obesity, unspecified: Secondary | ICD-10-CM

## 2013-01-23 DIAGNOSIS — O98519 Other viral diseases complicating pregnancy, unspecified trimester: Secondary | ICD-10-CM

## 2013-01-23 DIAGNOSIS — Z3480 Encounter for supervision of other normal pregnancy, unspecified trimester: Secondary | ICD-10-CM

## 2013-01-23 DIAGNOSIS — Z331 Pregnant state, incidental: Secondary | ICD-10-CM

## 2013-01-23 DIAGNOSIS — Z1389 Encounter for screening for other disorder: Secondary | ICD-10-CM

## 2013-01-23 DIAGNOSIS — O99210 Obesity complicating pregnancy, unspecified trimester: Secondary | ICD-10-CM

## 2013-01-23 LAB — POCT URINALYSIS DIPSTICK
Blood, UA: NEGATIVE
Glucose, UA: NEGATIVE
Ketones, UA: NEGATIVE

## 2013-01-23 NOTE — Progress Notes (Signed)
PT having pressure and pain when walking.

## 2013-01-23 NOTE — Patient Instructions (Addendum)
Epidural Risks and Benefits The continuous putting in (infusion) of local anesthetics through a long, narrow, hollow plastic tube (catheter)/needle into the lower (lumbar) area of your spine is commonly called an epidural. This means outside the covering of the spinal cord. The epidural catheter is placed in the space on the outside of the membrane that covers the spinal cord. The anesthetic medicine numbs the nerves of the spinal cord in the epidural space. There is also a spinal/epidural anesthetic using two needles and a catheter. The medication is first placed in the spinal canal. Then that needle is removed and a catheter is placed in the epidural space through the second needle for continuous anesthesia. This seems to be the most popular type of regional anesthesia used now. This is sometimes given for pain management to women who are giving birth. Spinal and epidural anesthesia are called regional anesthesia because they numb a certain region of the body. While it is an effective pain management tool, some reasons not to use this include:  Restricted mobility: The tubes and monitors connected to you do not allow for much moving around.  Increased likelihood of bladder catheterization, oxytocin administration, and internal monitoring. This means a tube (catheter) may have to be put into the bladder to drain the urine. Uterine contractions can become weaker and less frequent. They also may have a higher use of oxytocin than mothers not having regional anesthesia.  Increased likelihood of operative delivery: This includes the use of or need for forceps, vacuum extractor, episiotomy, or cesarean delivery. When the dose is too large, or when it sinks down into the "tailbone" (sacral) region of the body, the perineum and the birth canal (vagina) are anesthetized. Anesthetic is injected into this area late in labor to deaden all sensation. When it "accidentally" happens earlier in labor, the muscles of the  pelvic floor are relaxed too early. This interferes with the normal flexion and rotation of the baby's head as it passes through the birth canal. This interference can lead to abnormal presentations that are more dangerous for the baby.  Must use an automatic blood pressure cuff throughout labor. This is a cuff that automatically takes your blood pressure at regular intervals. SHORT TERM MATERNAL RISKS  Dural puncture - The dura is one of the membranes surrounding the spinal cord. If the anesthetic medication gets into the spinal canal through a dural puncture, it can result in a spinal anesthetic and spinal headache. Spinal headaches are treated with an epidural blood patch to cover the punctured area.  Low blood pressure (hypotension) - Nearly one third of women with an epidural will develop low blood pressure. The ways that patients must lay during the epidural can make this worse. Their position is limited because they will be unable to move their legs easily for the time of the anesthetic. Low blood pressure is also a risk for the baby. If the baby does not get enough oxygen from the mom's blood, it can result in an emergency Cesarean section. This means the baby is delivered by an operation through a cut by the surgeon (incision) on the belly of the mother.  Nausea, vomiting, and prolonged shivering.  Prolonged labor - With large doses of anesthetic medication, the patient loses the desire and the ability to bear down and push. This results in an increased use of forceps and vacuum extractions, compared to women having unmedicated deliveries.  Uneven, incomplete or non-existent pain relief. Sometimes the epidural does not work well and   additional medications may be needed for pain relief.  Difficulty breathing well or paralysis if the level of anesthesia goes too high in the spine.  Convulsions - If the anesthetic agent accidentally is injected into a blood vessel it can cause convulsions and  loss of consciousness.  Toxic drug reactions.  Septic meningitis - An abscess can form at the site where the epidural catheter is placed. If this spreads into the spinal canal it can cause meningitis.  Allergic reaction - This causes blood pressure to become too low and other medications and fluids must be given to bring the blood pressure up. Also rashes and difficulty breathing may develop.  Cardiac arrest - This is rare but real threat to the life of the mother and baby.  Fever is common.  Itching that is easily treated.  Spinal hematoma. LONG TERM MATERNAL RISKS  Neurological complications - A nerve problem called Horner's syndrome can develop with epidural anesthesia for vaginal delivery. It is impossible to predict which patients will develop a Horner's syndrome. Even the nerves to the face can be blocked, temporarily or permanently. Tremors and shakes can occur.  Paresthesia ("pins and needles"). This is a feeling that comes from inflammation of a nerve.  Dizziness and fainting can become a problem after epidurals. This is usually only for a couple of days. RISKS TO BABY  Direct drug toxicity.  Fetal distress, abnormal fetal heart rate (FHR) (can lead to emergency cesarean). This is especially true if the anesthetic gets into the mother's blood stream or too much medication is put into the epidural. REASONS NOT TO HAVE EPIDURAL ANESTHESIA  Increased costs.  The mother has a low blood pressure.  There are blood clotting problems.  A brain tumor is present.  There is an infection in the blood stream.  A skin infection at the needle site.  A tattoo at the needle site. BENEFITS  Regional anesthesia is the most effective pain relief for labor and delivery.  It is the best anesthetic for preeclampsia and eclampsia.  There is better pain control after delivery (vaginal or cesarean).  When done correctly, no medication gets to the baby.  Sooner ambulation after  delivery.  It can be left in place during all of labor.  You can be awake during a Cesarean delivery and see the baby immediately after delivery. AFTER THE PROCEDURE   You will be kept in bed for several hours to prevent headaches.  You will be kept in bed until your legs are no longer numb and it is safe to walk.  The length of time you spend in the hospital will depend on the type of surgery or procedure you have had.  The epidural catheter is removed after you no longer need it for pain. HOME CARE INSTRUCTIONS   Do not drive or operate any kind of machinery for at least 24 hours. Make sure there is someone to drive you home.  Do not drink alcohol for at least 24 hours after the anesthesia.  Do not make important decisions for at least 24 hours after the anesthesia.  Drink lots of fluids.  Return to your normal diet.  Keep all your postoperative appointments as scheduled. SEEK IMMEDIATE MEDICAL CARE IF:  You develop a fever or temperature over 98.6 F (37 C).  You have a persistent headache.  You develop dizziness, fainting or lightheadedness.  You develop weakness, numbness or tingling in your arms or legs.  You have a skin rash.  You   have difficulty breathing  You have a stiff neck with or without stiff back.  You develop chest pain. Document Released: 09/13/2005 Document Revised: 12/06/2011 Document Reviewed: 10/21/2008 ExitCare Patient Information 2013 [employer].  

## 2013-01-24 LAB — GC/CHLAMYDIA PROBE AMP
CT Probe RNA: NEGATIVE
GC Probe RNA: NEGATIVE

## 2013-01-24 NOTE — Progress Notes (Signed)
BP weight and urine results all reviewed and noted. Patient reports good fetal movement, denies any bleeding and no rupture of membranes symptoms or regular contractions. Patient is without complaints. All questions were answered. Cultures done

## 2013-01-26 LAB — CULTURE, BETA STREP (GROUP B ONLY)

## 2013-01-29 ENCOUNTER — Ambulatory Visit (INDEPENDENT_AMBULATORY_CARE_PROVIDER_SITE_OTHER): Payer: Medicaid Other | Admitting: Women's Health

## 2013-01-29 ENCOUNTER — Encounter: Payer: Self-pay | Admitting: Women's Health

## 2013-01-29 VITALS — BP 122/82 | Wt 237.8 lb

## 2013-01-29 DIAGNOSIS — O99210 Obesity complicating pregnancy, unspecified trimester: Secondary | ICD-10-CM

## 2013-01-29 DIAGNOSIS — Z6791 Unspecified blood type, Rh negative: Secondary | ICD-10-CM | POA: Insufficient documentation

## 2013-01-29 DIAGNOSIS — O98519 Other viral diseases complicating pregnancy, unspecified trimester: Secondary | ICD-10-CM

## 2013-01-29 DIAGNOSIS — Z1389 Encounter for screening for other disorder: Secondary | ICD-10-CM

## 2013-01-29 DIAGNOSIS — Z3483 Encounter for supervision of other normal pregnancy, third trimester: Secondary | ICD-10-CM

## 2013-01-29 DIAGNOSIS — O360130 Maternal care for anti-D [Rh] antibodies, third trimester, not applicable or unspecified: Secondary | ICD-10-CM

## 2013-01-29 DIAGNOSIS — Z331 Pregnant state, incidental: Secondary | ICD-10-CM

## 2013-01-29 DIAGNOSIS — B009 Herpesviral infection, unspecified: Secondary | ICD-10-CM

## 2013-01-29 DIAGNOSIS — IMO0002 Reserved for concepts with insufficient information to code with codable children: Secondary | ICD-10-CM

## 2013-01-29 DIAGNOSIS — O360990 Maternal care for other rhesus isoimmunization, unspecified trimester, not applicable or unspecified: Secondary | ICD-10-CM

## 2013-01-29 MED ORDER — RHO D IMMUNE GLOBULIN 1500 UNIT/2ML IJ SOLN
300.0000 ug | Freq: Once | INTRAMUSCULAR | Status: AC
  Administered 2013-01-29: 300 ug via INTRAMUSCULAR

## 2013-01-29 NOTE — Progress Notes (Signed)
Unable to void. Reports good fm. Denies uc's, lof, vb, urinary frequency, urgency, hesitancy, or dysuria.  Reports pelvic pressure x 1 week. Received rhogam today. H/O precip birth on side of 32 w/ 2nd child, induced w/ 3rd child for same. Lives 1hr away from [facility] in [place]. SVE today 3/70/-2 w/ small amount of blood on exam glove. Reviewed labor s/s and fetal kick counts. Recommended going to [facility] at very 1st sign of labor.  All questions answered. F/U 1 wk

## 2013-01-29 NOTE — Patient Instructions (Signed)
Braxton Hicks Contractions  Pregnancy is commonly associated with contractions of the uterus throughout the pregnancy. Towards the end of pregnancy (32 to 34 weeks), these contractions (Braxton Hicks) can develop more often and may become more forceful. This is not true labor because these contractions do not result in opening (dilatation) and thinning of the cervix. They are sometimes difficult to tell apart from true labor because these contractions can be forceful and people have different pain tolerances. You should not feel embarrassed if you go to the hospital with false labor. Sometimes, the only way to tell if you are in true labor is for your caregiver to follow the changes in the cervix.  How to tell the difference between true and false labor:  · False labor.  · The contractions of false labor are usually shorter, irregular and not as hard as those of true labor.  · They are often felt in the front of the lower abdomen and in the groin.  · They may leave with walking around or changing positions while lying down.  · They get weaker and are shorter lasting as time goes on.  · These contractions are usually irregular.  · They do not usually become progressively stronger, regular and closer together as with true labor.  · True labor.  · Contractions in true labor last 30 to 70 seconds, become very regular, usually become more intense, and increase in frequency.  · They do not go away with walking.  · The discomfort is usually felt in the top of the uterus and spreads to the lower abdomen and low back.  · True labor can be determined by your caregiver with an exam. This will show that the cervix is dilating and getting thinner.  If there are no prenatal problems or other health problems associated with the pregnancy, it is completely safe to be sent home with false labor and await the onset of true labor.  HOME CARE INSTRUCTIONS   · Keep up with your usual exercises and instructions.  · Take medications as  directed.  · Keep your regular prenatal appointment.  · Eat and drink lightly if you think you are going into labor.  · If BH contractions are making you uncomfortable:  · Change your activity position from lying down or resting to walking/walking to resting.  · Sit and rest in a tub of warm water.  · Drink 2 to 3 glasses of water. Dehydration may cause B-H contractions.  · Do slow and deep breathing several times an hour.  SEEK IMMEDIATE MEDICAL CARE IF:   · Your contractions continue to become stronger, more regular, and closer together.  · You have a gushing, burst or leaking of fluid from the vagina.  · An oral temperature above 102° F (38.9° C) develops.  · You have passage of blood-tinged mucus.  · You develop vaginal bleeding.  · You develop continuous belly (abdominal) pain.  · You have low back pain that you never had before.  · You feel the baby's head pushing down causing pelvic pressure.  · The baby is not moving as much as it used to.  Document Released: 09/13/2005 Document Revised: 12/06/2011 Document Reviewed: 03/07/2009  ExitCare® Patient Information ©2013 [employer].

## 2013-01-29 NOTE — Progress Notes (Signed)
Pressure

## 2013-02-05 ENCOUNTER — Encounter: Payer: Self-pay | Admitting: Obstetrics & Gynecology

## 2013-02-05 ENCOUNTER — Ambulatory Visit (INDEPENDENT_AMBULATORY_CARE_PROVIDER_SITE_OTHER): Payer: Medicaid Other | Admitting: Obstetrics & Gynecology

## 2013-02-05 VITALS — BP 120/80 | Wt 238.0 lb

## 2013-02-05 DIAGNOSIS — IMO0002 Reserved for concepts with insufficient information to code with codable children: Secondary | ICD-10-CM

## 2013-02-05 DIAGNOSIS — Z1389 Encounter for screening for other disorder: Secondary | ICD-10-CM

## 2013-02-05 DIAGNOSIS — Z331 Pregnant state, incidental: Secondary | ICD-10-CM

## 2013-02-05 DIAGNOSIS — O98519 Other viral diseases complicating pregnancy, unspecified trimester: Secondary | ICD-10-CM

## 2013-02-05 DIAGNOSIS — E669 Obesity, unspecified: Secondary | ICD-10-CM

## 2013-02-05 DIAGNOSIS — O360990 Maternal care for other rhesus isoimmunization, unspecified trimester, not applicable or unspecified: Secondary | ICD-10-CM

## 2013-02-05 LAB — POCT URINALYSIS DIPSTICK
Leukocytes, UA: NEGATIVE
Nitrite, UA: NEGATIVE
Protein, UA: NEGATIVE

## 2013-02-05 NOTE — Progress Notes (Signed)
HAVING A LOT OF PRESSURE. 

## 2013-02-05 NOTE — Progress Notes (Signed)
BP weight and urine results all reviewed and noted. Patient reports good fetal movement, denies any bleeding and no rupture of membranes symptoms or regular contractions. Patient is without complaints. All questions were answered.  

## 2013-02-13 ENCOUNTER — Ambulatory Visit (INDEPENDENT_AMBULATORY_CARE_PROVIDER_SITE_OTHER): Payer: Medicaid Other | Admitting: Women's Health

## 2013-02-13 ENCOUNTER — Encounter: Payer: Self-pay | Admitting: Women's Health

## 2013-02-13 ENCOUNTER — Telehealth (HOSPITAL_COMMUNITY): Payer: Self-pay | Admitting: *Deleted

## 2013-02-13 VITALS — BP 110/68 | Wt 234.2 lb

## 2013-02-13 DIAGNOSIS — Z1389 Encounter for screening for other disorder: Secondary | ICD-10-CM

## 2013-02-13 DIAGNOSIS — O99210 Obesity complicating pregnancy, unspecified trimester: Secondary | ICD-10-CM

## 2013-02-13 DIAGNOSIS — O98519 Other viral diseases complicating pregnancy, unspecified trimester: Secondary | ICD-10-CM

## 2013-02-13 DIAGNOSIS — O360990 Maternal care for other rhesus isoimmunization, unspecified trimester, not applicable or unspecified: Secondary | ICD-10-CM

## 2013-02-13 DIAGNOSIS — Z331 Pregnant state, incidental: Secondary | ICD-10-CM

## 2013-02-13 DIAGNOSIS — Z3483 Encounter for supervision of other normal pregnancy, third trimester: Secondary | ICD-10-CM

## 2013-02-13 LAB — POCT URINALYSIS DIPSTICK
Leukocytes, UA: NEGATIVE
Nitrite, UA: NEGATIVE
Protein, UA: NEGATIVE

## 2013-02-13 NOTE — Patient Instructions (Addendum)
Your induction is scheduled for 5/29 @ 6:30am. Go to the Maternity Admission Unit desk. Braxton Hicks Contractions Pregnancy is commonly associated with contractions of the uterus throughout the pregnancy. Towards the end of pregnancy (32 to 34 weeks), these contractions [facility]) can develop more often and may become more forceful. This is not true labor because these contractions do not result in opening (dilatation) and thinning of the cervix. They are sometimes difficult to tell apart from true labor because these contractions can be forceful and people have different pain tolerances. You should not feel embarrassed if you go to the hospital with false labor. Sometimes, the only way to tell if you are in true labor is for your caregiver to follow the changes in the cervix. How to tell the difference between true and false labor:  False labor.  The contractions of false labor are usually shorter, irregular and not as hard as those of true labor.  They are often felt in the front of the lower abdomen and in the groin.  They may leave with walking around or changing positions while lying down.  They get weaker and are shorter lasting as time goes on.  These contractions are usually irregular.  They do not usually become progressively stronger, regular and closer together as with true labor.  True labor.  Contractions in true labor last 30 to 70 seconds, become very regular, usually become more intense, and increase in frequency.  They do not go away with walking.  The discomfort is usually felt in the top of the uterus and spreads to the lower abdomen and low back.  True labor can be determined by your caregiver with an exam. This will show that the cervix is dilating and getting thinner. If there are no prenatal problems or other health problems associated with the pregnancy, it is completely safe to be sent home with false labor and await the onset of true labor. HOME CARE  INSTRUCTIONS   Keep up with your usual exercises and instructions.  Take medications as directed.  Keep your regular prenatal appointment.  Eat and drink lightly if you think you are going into labor.  If BH contractions are making you uncomfortable:  Change your activity position from lying down or resting to walking/walking to resting.  Sit and rest in a tub of warm water.  Drink 2 to 3 glasses of water. Dehydration may cause B-H contractions.  Do slow and deep breathing several times an hour. SEEK IMMEDIATE MEDICAL CARE IF:   Your contractions continue to become stronger, more regular, and closer together.  You have a gushing, burst or leaking of fluid from the vagina.  An oral temperature above 102 F (38.9 C) develops.  You have passage of blood-tinged mucus.  You develop vaginal bleeding.  You develop continuous belly (abdominal) pain.  You have low back pain that you never had before.  You feel the baby's head pushing down causing pelvic pressure.  The baby is not moving as much as it used to. Document Released: 09/13/2005 Document Revised: 12/06/2011 Document Reviewed: 03/07/2009 [facility] Patient Information 2013 [place], [place].

## 2013-02-13 NOTE — Telephone Encounter (Signed)
Preadmission screen  

## 2013-02-13 NOTE — Progress Notes (Signed)
Reports good fm. Denies uc's, lof, vb, urinary frequency, urgency, hesitancy, or dysuria.  Having pelvic pressure, but no change since last visit.  Requested membrane sweeping- discussed r/b- pt wanted to proceed, so membranes swept. Reviewed labor s/s and fetal kick counts.  To go to [facility] at very first sign of labor d/t h/o birth on side of road. All questions answered. F/U 3days for NST and visit if still pregnant. IOL for postdates: 5/29 @ 0630 if needed.

## 2013-02-13 NOTE — Progress Notes (Signed)
Having lots of pressure. 

## 2013-02-16 ENCOUNTER — Ambulatory Visit (INDEPENDENT_AMBULATORY_CARE_PROVIDER_SITE_OTHER): Payer: Medicaid Other | Admitting: Obstetrics & Gynecology

## 2013-02-16 ENCOUNTER — Inpatient Hospital Stay (HOSPITAL_COMMUNITY)
Admission: AD | Admit: 2013-02-16 | Discharge: 2013-02-18 | DRG: 767 | Disposition: A | Discharge status: Home / Self Care | Payer: Medicaid Other | Source: Ambulatory Visit | Attending: Obstetrics and Gynecology | Admitting: Obstetrics and Gynecology

## 2013-02-16 ENCOUNTER — Encounter (HOSPITAL_COMMUNITY): Payer: Self-pay | Admitting: Anesthesiology

## 2013-02-16 ENCOUNTER — Inpatient Hospital Stay (HOSPITAL_COMMUNITY): Payer: Medicaid Other | Admitting: Anesthesiology

## 2013-02-16 ENCOUNTER — Encounter: Payer: Self-pay | Admitting: Obstetrics & Gynecology

## 2013-02-16 ENCOUNTER — Encounter (HOSPITAL_COMMUNITY): Payer: Self-pay | Admitting: *Deleted

## 2013-02-16 VITALS — BP 120/80 | Wt 238.0 lb

## 2013-02-16 DIAGNOSIS — O0940 Supervision of pregnancy with grand multiparity, unspecified trimester: Secondary | ICD-10-CM

## 2013-02-16 DIAGNOSIS — O360130 Maternal care for anti-D [Rh] antibodies, third trimester, not applicable or unspecified: Secondary | ICD-10-CM

## 2013-02-16 DIAGNOSIS — O480 Post-term pregnancy: Secondary | ICD-10-CM

## 2013-02-16 DIAGNOSIS — Z302 Encounter for sterilization: Secondary | ICD-10-CM

## 2013-02-16 DIAGNOSIS — Z3483 Encounter for supervision of other normal pregnancy, third trimester: Secondary | ICD-10-CM

## 2013-02-16 DIAGNOSIS — B009 Herpesviral infection, unspecified: Secondary | ICD-10-CM

## 2013-02-16 LAB — CBC
HCT: 34.5 % — ABNORMAL LOW (ref 36.0–46.0)
Hemoglobin: 11.7 g/dL — ABNORMAL LOW (ref 12.0–15.0)
MCV: 90.8 fL (ref 78.0–100.0)
WBC: 17 10*3/uL — ABNORMAL HIGH (ref 4.0–10.5)

## 2013-02-16 MED ORDER — FENTANYL 2.5 MCG/ML BUPIVACAINE 1/10 % EPIDURAL INFUSION (WH - ANES)
INTRAMUSCULAR | Status: DC | PRN
  Administered 2013-02-16: 14 mL/h via EPIDURAL

## 2013-02-16 MED ORDER — LIDOCAINE HCL (PF) 1 % IJ SOLN
30.0000 mL | INTRAMUSCULAR | Status: DC | PRN
  Filled 2013-02-16 (×2): qty 30

## 2013-02-16 MED ORDER — LACTATED RINGERS IV SOLN
500.0000 mL | Freq: Once | INTRAVENOUS | Status: AC
  Administered 2013-02-16: 500 mL via INTRAVENOUS

## 2013-02-16 MED ORDER — LIDOCAINE HCL (PF) 1 % IJ SOLN
INTRAMUSCULAR | Status: DC | PRN
  Administered 2013-02-16 (×2): 4 mL

## 2013-02-16 MED ORDER — OXYTOCIN 40 UNITS IN LACTATED RINGERS INFUSION - SIMPLE MED
62.5000 mL/h | INTRAVENOUS | Status: DC
  Filled 2013-02-16: qty 1000

## 2013-02-16 MED ORDER — LACTATED RINGERS IV SOLN
INTRAVENOUS | Status: DC
  Administered 2013-02-16 (×2): via INTRAVENOUS

## 2013-02-16 MED ORDER — FENTANYL 2.5 MCG/ML BUPIVACAINE 1/10 % EPIDURAL INFUSION (WH - ANES): 14.0000 mL/h | INTRAMUSCULAR | Status: DC | PRN

## 2013-02-16 MED ORDER — ACETAMINOPHEN 325 MG PO TABS: 650.0000 mg | ORAL_TABLET | ORAL | Status: DC | PRN

## 2013-02-16 MED ORDER — EPHEDRINE 5 MG/ML INJ
10.0000 mg | INTRAVENOUS | Status: DC | PRN
  Filled 2013-02-16: qty 2
  Filled 2013-02-16: qty 4

## 2013-02-16 MED ORDER — PHENYLEPHRINE 40 MCG/ML (10ML) SYRINGE FOR IV PUSH (FOR BLOOD PRESSURE SUPPORT)
80.0000 ug | PREFILLED_SYRINGE | INTRAVENOUS | Status: DC | PRN
  Filled 2013-02-16: qty 2
  Filled 2013-02-16: qty 5

## 2013-02-16 MED ORDER — CITRIC ACID-SODIUM CITRATE 334-500 MG/5ML PO SOLN
30.0000 mL | ORAL | Status: DC | PRN
  Administered 2013-02-16: 30 mL via ORAL
  Filled 2013-02-16: qty 15

## 2013-02-16 MED ORDER — LACTATED RINGERS IV SOLN: 500.0000 mL | INTRAVENOUS | Status: DC | PRN

## 2013-02-16 MED ORDER — ACYCLOVIR 200 MG PO CAPS
400.0000 mg | ORAL_CAPSULE | Freq: Three times a day (TID) | ORAL | Status: DC
  Administered 2013-02-16: 400 mg via ORAL
  Filled 2013-02-16 (×3): qty 2

## 2013-02-16 MED ORDER — FENTANYL 2.5 MCG/ML BUPIVACAINE 1/10 % EPIDURAL INFUSION (WH - ANES)
14.0000 mL/h | INTRAMUSCULAR | Status: DC | PRN
  Administered 2013-02-16: 14 mL/h via EPIDURAL
  Filled 2013-02-16 (×2): qty 125

## 2013-02-16 MED ORDER — OXYCODONE-ACETAMINOPHEN 5-325 MG PO TABS: 1.0000 | ORAL_TABLET | ORAL | Status: DC | PRN

## 2013-02-16 MED ORDER — ONDANSETRON HCL 4 MG/2ML IJ SOLN: 4.0000 mg | Freq: Four times a day (QID) | INTRAMUSCULAR | Status: DC | PRN

## 2013-02-16 MED ORDER — NALBUPHINE SYRINGE 5 MG/0.5 ML
10.0000 mg | INJECTION | INTRAMUSCULAR | Status: DC | PRN
  Administered 2013-02-16: 10 mg via INTRAVENOUS
  Filled 2013-02-16 (×2): qty 1

## 2013-02-16 MED ORDER — OXYTOCIN BOLUS FROM INFUSION
500.0000 mL | INTRAVENOUS | Status: DC
  Administered 2013-02-16: 500 mL via INTRAVENOUS

## 2013-02-16 MED ORDER — DIPHENHYDRAMINE HCL 50 MG/ML IJ SOLN: 12.5000 mg | INTRAMUSCULAR | Status: DC | PRN

## 2013-02-16 MED ORDER — EPHEDRINE 5 MG/ML INJ
10.0000 mg | INTRAVENOUS | Status: DC | PRN
  Filled 2013-02-16: qty 2

## 2013-02-16 MED ORDER — IBUPROFEN 600 MG PO TABS
600.0000 mg | ORAL_TABLET | Freq: Four times a day (QID) | ORAL | Status: DC | PRN
  Filled 2013-02-16 (×2): qty 1

## 2013-02-16 MED ORDER — PHENYLEPHRINE 40 MCG/ML (10ML) SYRINGE FOR IV PUSH (FOR BLOOD PRESSURE SUPPORT)
80.0000 ug | PREFILLED_SYRINGE | INTRAVENOUS | Status: DC | PRN
  Filled 2013-02-16: qty 2

## 2013-02-16 NOTE — MAU Note (Signed)
Patient states she started leaking clear fluid at 1200 and is having contractions every 3 minutes.

## 2013-02-16 NOTE — H&P (Signed)
Olivia Small is a 27 y.o. female [phone] at [redacted]w[redacted]d presenting for contractions and loss of fluid. Contractions started around 10 am this morning and her water broke about around 4 pm today. She denies bleeding and has not felt the baby move much this afternoon.  She gets prenatal care at [facility]. No complications of this pregnancy. She is on acyclovir for positive HSV serology but denies history of HSV lesions or any active lesions. Her last child delivered in the ambulance en route to the hospital.   Maternal Medical History:  Reason for admission: Rupture of membranes and contractions.   Contractions: Onset was 6-12 hours ago.   Frequency: regular.   Perceived severity is moderate.    Fetal activity: Perceived fetal activity is normal.   Last perceived fetal movement was within the past 12 hours.    Prenatal complications: no prenatal complications    OB History   Grav Para Term Preterm Abortions TAB SAB Ect Mult Living   4 3 3  0 0 0 0 0 0 3     Past Medical History  Diagnosis Date  . 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 No pertinent past medical history    Past Surgical History  Procedure Laterality Date  . Cholecystectomy  05/10/2012    Procedure: LAPAROSCOPIC CHOLECYSTECTOMY;  Surgeon: Fabio Bering, MD;  Location: [facility];  Service: General;  Laterality: N/A;   Patient Active Problem List   Diagnosis Date Noted  . Rh negative state in antepartum period 01/29/2013  . Rapid first stage of labor 01/29/2013  . Supervision of other normal pregnancy 01/08/2013  . 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 Social History:  reports that she has never smoked. She does not have any smokeless tobacco history on file. She reports that  drinks alcohol. She reports that she does not use illicit drugs.   Prenatal Transfer Tool  Maternal  Diabetes: No Genetic Screening: Declined Maternal Ultrasounds/Referrals: Normal Fetal Ultrasounds or other Referrals:  None Maternal Substance Abuse:  No Significant Maternal Medications:  None Significant Maternal Lab Results:  Lab values include: Group B Strep negative, Rh negative Other Comments:  None  ROS  Pertinent pos and neg mentioned in HPI  Dilation: 4 Effacement (%): 90 Station: -1 Exam by:: DCALLAWAY, RN Blood pressure 126/83, pulse 112, temperature 98.6 F (37 C), temperature source Oral, resp. rate 20, height 5' 5.5" (1.664 m), weight 107.321 kg (236 lb 9.6 oz), last menstrual period 05/10/2012, not currently breastfeeding.   Maternal Exam:  Uterine Assessment: Contraction strength is moderate.  Contraction frequency is regular.   Abdomen: Fetal presentation: vertex  Introitus: Ferning test: positive.   Pelvis: adequate for delivery.   Cervix: Cervix evaluated by digital exam.     Fetal Exam Fetal Monitor Review: Mode: ultrasound.   Baseline rate: 140.  Variability: moderate (6-25 bpm).   Pattern: accelerations present and no decelerations.    Fetal State Assessment: Category I - tracings are normal.     Physical Exam  Constitutional: She is oriented to person, place, and time. She appears well-developed and well-nourished. No distress.  HENT:  Head: Normocephalic and atraumatic.  Eyes: Conjunctivae and EOM are normal.  Neck: Normal range of motion. Neck supple.  Cardiovascular: Normal rate.   Respiratory: Effort normal. No respiratory distress.  GI: There is no  tenderness. There is no rebound and no guarding.  Musculoskeletal: Normal range of motion. She exhibits no edema and no tenderness.  Neurological: She is alert and oriented to person, place, and time.  Skin: Skin is warm and dry.  Psychiatric: She has a normal mood and affect.    Prenatal labs: ABO, Rh: O/--/-- (10/14 0000) Antibody: NEG (03/26 0951) Rubella:   RPR: NON REAC (03/26  0951)  HBsAg:    HIV: NON REACTIVE (03/26 0951)  GBS: Negative (04/29 0000)   Assessment/Plan: 27 y.o. [id number] at [redacted]w[redacted]d with SOL/SROM - Admit to L&D - Already contracting and 4 cm. Add pitocin if needed - GBS negative, no prophylaxis - Anticipate SVD   Napoleon Form 02/16/2013, 5:28 PM

## 2013-02-16 NOTE — Progress Notes (Signed)
BP weight and urine results all reviewed and noted. Patient reports good fetal movement, denies any bleeding and no rupture of membranes symptoms or regular contractions. Patient is without complaints. All questions were answered.  

## 2013-02-16 NOTE — Progress Notes (Signed)
Seen also by me. Agree with note Marie Williams CNM 

## 2013-02-16 NOTE — Anesthesia Preprocedure Evaluation (Signed)
Anesthesia Evaluation  Patient identified by MRN, date of birth, ID band Patient awake    Reviewed: Allergy & Precautions, H&P , NPO status , Patient's Chart, lab work & pertinent test results  Airway Mallampati: II TM Distance: >3 FB Neck ROM: Full    Dental  (+) Teeth Intact and Dental Advisory Given   Pulmonary neg pulmonary ROS,  breath sounds clear to auscultation        Cardiovascular negative cardio ROS  Rhythm:Regular Rate:Normal     Neuro/Psych negative neurological ROS  negative psych ROS   GI/Hepatic negative GI ROS, Neg liver ROS,   Endo/Other  Morbid obesity  Renal/GU negative Renal ROS     Musculoskeletal negative musculoskeletal ROS (+)   Abdominal   Peds  Hematology negative hematology ROS (+)   Anesthesia Other Findings   Reproductive/Obstetrics (+) Pregnancy                           Anesthesia Physical  Anesthesia Plan  ASA: II  Anesthesia Plan: Epidural   Post-op Pain Management:    Induction:   Airway Management Planned:   Additional Equipment:   Intra-op Plan:   Post-operative Plan:   Informed Consent: I have reviewed the patients History and Physical, chart, labs and discussed the procedure including the risks, benefits and alternatives for the proposed anesthesia with the patient or authorized representative who has indicated his/her understanding and acceptance.     Plan Discussed with:   Anesthesia Plan Comments:         Anesthesia Quick Evaluation

## 2013-02-16 NOTE — Anesthesia Procedure Notes (Signed)
Epidural Patient location during procedure: OB Start time: 02/16/2013 7:30 PM End time: 02/16/2013 7:45 PM  Staffing Anesthesiologist: Lewie Loron R Performed by: anesthesiologist   Preanesthetic Checklist Completed: patient identified, pre-op evaluation, timeout performed, IV checked, risks and benefits discussed and monitors and equipment checked  Epidural Patient position: sitting Prep: DuraPrep Patient monitoring: heart rate, continuous pulse ox and blood pressure Approach: midline Injection technique: LOR air and LOR saline  Needle:  Needle type: Tuohy  Needle gauge: 17 G Needle length: 9 cm Needle insertion depth: 8 cm Catheter type: closed end flexible Catheter size: 19 Gauge Catheter at skin depth: 13 cm Test dose: negative  Assessment Sensory level: T8 Events: blood not aspirated, injection not painful, no injection resistance, negative IV test and no paresthesia  Additional Notes Reason for block:procedure for pain

## 2013-02-16 NOTE — MAU Note (Addendum)
PT SAYS SHE WAS  IN DR'S OFFICE -  VE 4 CM- HAD NST-  GOOD.     THEN AT 1400PM-  HER SROM- CLEAR FLUID.     GBS- NEG.    HAD BLOOD  TEST IN OFFICE- POSTIVE FOR HSV-  NEVER HAD AN OUTBREAK-  TAKING VALTREX.

## 2013-02-16 NOTE — Progress Notes (Signed)
Olivia Small is a 27 y.o. [phone] at [redacted]w[redacted]d by LMP admitted for active labor, rupture of membranes  Subjective: Patient uncomfortable, awaiting 3rd attempt of Epidural  Objective: BP 119/97  Pulse 119  Temp(Src) 98.3 F (36.8 C) (Oral)  Resp 18  Ht 5' 5.5" (1.664 m)  Wt 107.321 kg (236 lb 9.6 oz)  BMI 38.76 kg/m2  SpO2 99%  LMP 05/10/2012      FHT:  FHR: 130 bpm, variability: moderate,  accelerations:  Present,  decelerations:  Absent UC:   regular, every 5 minutes SVE:   Dilation: 5 Effacement (%): 90 Station: -1 Exam by:: Olivia Small, CNM  Labs: Lab Results  Component Value Date   WBC 17.0* 02/16/2013   HGB 11.7* 02/16/2013   HCT 34.5* 02/16/2013   MCV 90.8 02/16/2013   PLT 137* 02/16/2013    Assessment / Plan: Spontaneous labor, progressing normally  Labor: Progressing normally Fetal Wellbeing:  Category I Pain Control:  Epidural, pending Anticipated MOD:  NSVD  Olivia Small Other 02/16/2013, 9:34 PM

## 2013-02-17 ENCOUNTER — Encounter (HOSPITAL_COMMUNITY): Payer: Self-pay | Admitting: Anesthesiology

## 2013-02-17 ENCOUNTER — Encounter (HOSPITAL_COMMUNITY): Payer: Self-pay | Admitting: *Deleted

## 2013-02-17 ENCOUNTER — Inpatient Hospital Stay (HOSPITAL_COMMUNITY): Payer: Medicaid Other | Admitting: Anesthesiology

## 2013-02-17 ENCOUNTER — Encounter (HOSPITAL_COMMUNITY)
Admission: AD | Disposition: A | Discharge status: Home / Self Care | Payer: Self-pay | Source: Ambulatory Visit | Attending: Obstetrics and Gynecology

## 2013-02-17 DIAGNOSIS — Z302 Encounter for sterilization: Secondary | ICD-10-CM

## 2013-02-17 HISTORY — PX: TUBAL LIGATION: SHX77

## 2013-02-17 LAB — CBC
MCH: 30.5 pg (ref 26.0–34.0)
MCHC: 33.6 g/dL (ref 30.0–36.0)
Platelets: 120 10*3/uL — ABNORMAL LOW (ref 150–400)
RBC: 3.18 MIL/uL — ABNORMAL LOW (ref 3.87–5.11)

## 2013-02-17 SURGERY — LIGATION, FALLOPIAN TUBE, POSTPARTUM
Anesthesia: Epidural | Site: Abdomen | Laterality: Bilateral | Wound class: Clean

## 2013-02-17 MED ORDER — SODIUM BICARBONATE 8.4 % IV SOLN
INTRAVENOUS | Status: AC
  Filled 2013-02-17: qty 50

## 2013-02-17 MED ORDER — BENZOCAINE-MENTHOL 20-0.5 % EX AERO: 1.0000 "application " | INHALATION_SPRAY | CUTANEOUS | Status: DC | PRN

## 2013-02-17 MED ORDER — TETANUS-DIPHTH-ACELL PERTUSSIS 5-2.5-18.5 LF-MCG/0.5 IM SUSP
0.5000 mL | Freq: Once | INTRAMUSCULAR | Status: AC
  Administered 2013-02-18: 0.5 mL via INTRAMUSCULAR
  Filled 2013-02-17: qty 0.5

## 2013-02-17 MED ORDER — SIMETHICONE 80 MG PO CHEW: 80.0000 mg | CHEWABLE_TABLET | ORAL | Status: DC | PRN

## 2013-02-17 MED ORDER — BUPIVACAINE HCL (PF) 0.25 % IJ SOLN
INTRAMUSCULAR | Status: AC
  Filled 2013-02-17: qty 30

## 2013-02-17 MED ORDER — ONDANSETRON HCL 4 MG/2ML IJ SOLN
INTRAMUSCULAR | Status: AC
  Filled 2013-02-17: qty 2

## 2013-02-17 MED ORDER — ONDANSETRON HCL 4 MG/2ML IJ SOLN
INTRAMUSCULAR | Status: DC | PRN
  Administered 2013-02-17: 4 mg via INTRAVENOUS

## 2013-02-17 MED ORDER — PRENATAL MULTIVITAMIN CH
1.0000 | ORAL_TABLET | Freq: Every day | ORAL | Status: DC
  Administered 2013-02-18: 1 via ORAL
  Filled 2013-02-17: qty 1

## 2013-02-17 MED ORDER — MIDAZOLAM HCL 5 MG/5ML IJ SOLN
INTRAMUSCULAR | Status: DC | PRN
  Administered 2013-02-17: 2 mg via INTRAVENOUS

## 2013-02-17 MED ORDER — ONDANSETRON HCL 4 MG PO TABS: 4.0000 mg | ORAL_TABLET | ORAL | Status: DC | PRN

## 2013-02-17 MED ORDER — IBUPROFEN 600 MG PO TABS
600.0000 mg | ORAL_TABLET | Freq: Four times a day (QID) | ORAL | Status: DC
  Administered 2013-02-17 – 2013-02-18 (×7): 600 mg via ORAL
  Filled 2013-02-17 (×7): qty 1

## 2013-02-17 MED ORDER — SUCCINYLCHOLINE CHLORIDE 20 MG/ML IJ SOLN
INTRAMUSCULAR | Status: DC | PRN
  Administered 2013-02-17: 120 mg via INTRAVENOUS

## 2013-02-17 MED ORDER — DIPHENHYDRAMINE HCL 25 MG PO CAPS: 25.0000 mg | ORAL_CAPSULE | Freq: Four times a day (QID) | ORAL | Status: DC | PRN

## 2013-02-17 MED ORDER — WITCH HAZEL-GLYCERIN EX PADS: 1.0000 "application " | MEDICATED_PAD | CUTANEOUS | Status: DC | PRN

## 2013-02-17 MED ORDER — METOCLOPRAMIDE HCL 10 MG PO TABS
10.0000 mg | ORAL_TABLET | Freq: Once | ORAL | Status: AC
  Administered 2013-02-17: 10 mg via ORAL
  Filled 2013-02-17: qty 1

## 2013-02-17 MED ORDER — LIDOCAINE HCL (CARDIAC) 20 MG/ML IV SOLN
INTRAVENOUS | Status: DC | PRN
  Administered 2013-02-17: 30 mg via INTRAVENOUS

## 2013-02-17 MED ORDER — LIDOCAINE-EPINEPHRINE (PF) 2 %-1:200000 IJ SOLN
INTRAMUSCULAR | Status: DC | PRN
  Administered 2013-02-17 (×4): 5 mL via INTRADERMAL

## 2013-02-17 MED ORDER — FENTANYL CITRATE 0.05 MG/ML IJ SOLN
INTRAMUSCULAR | Status: AC
  Filled 2013-02-17: qty 2

## 2013-02-17 MED ORDER — ONDANSETRON HCL 4 MG/2ML IJ SOLN: 4.0000 mg | INTRAMUSCULAR | Status: DC | PRN

## 2013-02-17 MED ORDER — LIDOCAINE-EPINEPHRINE (PF) 2 %-1:200000 IJ SOLN
INTRAMUSCULAR | Status: AC
  Filled 2013-02-17: qty 20

## 2013-02-17 MED ORDER — SUCCINYLCHOLINE CHLORIDE 20 MG/ML IJ SOLN
INTRAMUSCULAR | Status: AC
  Filled 2013-02-17: qty 10

## 2013-02-17 MED ORDER — PROPOFOL 10 MG/ML IV EMUL
INTRAVENOUS | Status: AC
  Filled 2013-02-17: qty 20

## 2013-02-17 MED ORDER — SODIUM CHLORIDE 0.9 % IR SOLN
Status: DC | PRN
  Administered 2013-02-17: 1000 mL

## 2013-02-17 MED ORDER — LACTATED RINGERS IV SOLN
INTRAVENOUS | Status: DC
  Administered 2013-02-17 (×2): via INTRAVENOUS

## 2013-02-17 MED ORDER — FAMOTIDINE 20 MG PO TABS
40.0000 mg | ORAL_TABLET | Freq: Once | ORAL | Status: AC
  Administered 2013-02-17: 40 mg via ORAL
  Filled 2013-02-17: qty 2
  Filled 2013-02-17: qty 1

## 2013-02-17 MED ORDER — LANOLIN HYDROUS EX OINT: TOPICAL_OINTMENT | CUTANEOUS | Status: DC | PRN

## 2013-02-17 MED ORDER — PROMETHAZINE HCL 25 MG/ML IJ SOLN: 6.2500 mg | INTRAMUSCULAR | Status: DC | PRN

## 2013-02-17 MED ORDER — FENTANYL CITRATE 0.05 MG/ML IJ SOLN
INTRAMUSCULAR | Status: DC | PRN
  Administered 2013-02-17: 25 ug via INTRAVENOUS
  Administered 2013-02-17: 50 ug via INTRAVENOUS
  Administered 2013-02-17: 25 ug via INTRAVENOUS
  Administered 2013-02-17: 100 ug via INTRAVENOUS

## 2013-02-17 MED ORDER — MEPERIDINE HCL 25 MG/ML IJ SOLN: 6.2500 mg | INTRAMUSCULAR | Status: DC | PRN

## 2013-02-17 MED ORDER — DIBUCAINE 1 % RE OINT: 1.0000 "application " | TOPICAL_OINTMENT | RECTAL | Status: DC | PRN

## 2013-02-17 MED ORDER — PROPOFOL 10 MG/ML IV BOLUS
INTRAVENOUS | Status: DC | PRN
  Administered 2013-02-17: 200 mg via INTRAVENOUS

## 2013-02-17 MED ORDER — BUPIVACAINE HCL (PF) 0.25 % IJ SOLN
INTRAMUSCULAR | Status: DC | PRN
  Administered 2013-02-17: 11 mL

## 2013-02-17 MED ORDER — OXYCODONE-ACETAMINOPHEN 5-325 MG PO TABS
1.0000 | ORAL_TABLET | ORAL | Status: DC | PRN
  Administered 2013-02-17: 1 via ORAL
  Administered 2013-02-18 (×2): 2 via ORAL
  Filled 2013-02-17 (×2): qty 2
  Filled 2013-02-17: qty 1

## 2013-02-17 MED ORDER — LACTATED RINGERS IV SOLN: INTRAVENOUS | Status: DC

## 2013-02-17 MED ORDER — FENTANYL CITRATE 0.05 MG/ML IJ SOLN: 25.0000 ug | INTRAMUSCULAR | Status: DC | PRN

## 2013-02-17 MED ORDER — LACTATED RINGERS IV SOLN
INTRAVENOUS | Status: DC
  Administered 2013-02-17: 11:00:00 via INTRAVENOUS

## 2013-02-17 MED ORDER — MIDAZOLAM HCL 2 MG/2ML IJ SOLN
INTRAMUSCULAR | Status: AC
  Filled 2013-02-17: qty 2

## 2013-02-17 MED ORDER — SENNOSIDES-DOCUSATE SODIUM 8.6-50 MG PO TABS
2.0000 | ORAL_TABLET | Freq: Every day | ORAL | Status: DC
  Administered 2013-02-17: 2 via ORAL

## 2013-02-17 MED ORDER — ZOLPIDEM TARTRATE 5 MG PO TABS: 5.0000 mg | ORAL_TABLET | Freq: Every evening | ORAL | Status: DC | PRN

## 2013-02-17 SURGICAL SUPPLY — 21 items
BLADE SURG 11 STRL SS (BLADE) ×2 IMPLANT
CHLORAPREP W/TINT 26ML (MISCELLANEOUS) ×2 IMPLANT
CLIP FILSHIE TUBAL LIGA STRL (Clip) ×3 IMPLANT
CLOTH BEACON ORANGE TIMEOUT ST (SAFETY) ×2 IMPLANT
DRSG COVADERM PLUS 2X2 (GAUZE/BANDAGES/DRESSINGS) ×1 IMPLANT
GLOVE BIO SURGEON STRL SZ 6.5 (GLOVE) ×2 IMPLANT
GLOVE BIOGEL PI IND STRL 7.0 (GLOVE) ×1 IMPLANT
GLOVE BIOGEL PI INDICATOR 7.0 (GLOVE) ×3
GOWN PREVENTION PLUS LG XLONG (DISPOSABLE) ×4 IMPLANT
NDL HYPO 25X1 1.5 SAFETY (NEEDLE) ×1 IMPLANT
NEEDLE HYPO 25X1 1.5 SAFETY (NEEDLE) ×2 IMPLANT
NS IRRIG 1000ML POUR BTL (IV SOLUTION) ×2 IMPLANT
PACK ABDOMINAL MINOR (CUSTOM PROCEDURE TRAY) ×2 IMPLANT
SPONGE LAP 4X18 X RAY DECT (DISPOSABLE) ×1 IMPLANT
SUT VIC AB 0 CT1 27 (SUTURE) ×2
SUT VIC AB 0 CT1 27XBRD ANBCTR (SUTURE) ×1 IMPLANT
SUT VICRYL 4-0 PS2 18IN ABS (SUTURE) ×2 IMPLANT
SYR CONTROL 10ML LL (SYRINGE) ×2 IMPLANT
TOWEL OR 17X24 6PK STRL BLUE (TOWEL DISPOSABLE) ×4 IMPLANT
TRAY FOLEY BAG SILVER LF 14FR (CATHETERS) ×2 IMPLANT
WATER STERILE IRR 1000ML POUR (IV SOLUTION) ×2 IMPLANT

## 2013-02-17 NOTE — Transfer of Care (Signed)
Immediate Anesthesia Transfer of Care Note  Patient: Olivia Small  Procedure(s) Performed: Procedure(s): POST PARTUM TUBAL LIGATION (Bilateral)  Patient Location: PACU  Anesthesia Type:General and Epidural  Level of Consciousness: awake and alert   Airway & Oxygen Therapy: Patient Spontanous Breathing and Patient connected to nasal cannula oxygen  Post-op Assessment: Report given to PACU RN and Post -op Vital signs reviewed and stable  Post vital signs: Reviewed and stable  Complications: No apparent anesthesia complications

## 2013-02-17 NOTE — Anesthesia Postprocedure Evaluation (Signed)
  Anesthesia Post-op Note  Patient: Olivia Small  Procedure(s) Performed: Procedure(s) (LRB): POST PARTUM TUBAL LIGATION (Bilateral)  Patient Location: PACU  Anesthesia Type: General  Level of Consciousness: awake and alert   Airway and Oxygen Therapy: Patient Spontanous Breathing  Post-op Pain: mild  Post-op Assessment: Post-op Vital signs reviewed, Patient's Cardiovascular Status Stable, Respiratory Function Stable, Patent Airway and No signs of Nausea or vomiting  Last Vitals:  Filed Vitals:   02/17/13 1545  BP:   Pulse: 77  Temp: 36.9 C  Resp: 16    Post-op Vital Signs: stable   Complications: No apparent anesthesia complications

## 2013-02-17 NOTE — Progress Notes (Signed)
Post Partum Day 1  Subjective: no complaints, up ad lib, voiding and tolerating PO  Objective: Blood pressure 108/64, pulse 111, temperature 98.4 F (36.9 C), temperature source Oral, resp. rate 20, height 5' 5.5" (1.664 m), weight 107.321 kg (236 lb 9.6 oz), last menstrual period 05/10/2012, SpO2 98.00%, unknown if currently breastfeeding.  Physical Exam:  General: alert, cooperative and no distress Lochia: appropriate Uterine Fundus: firm DVT Evaluation: No evidence of DVT seen on physical exam. No cords or calf tenderness. No significant calf/ankle edema.   Recent Labs  02/16/13 1734  HGB 11.7*  HCT 34.5*    Assessment/Plan: Patient doing well.  Currently breastfeeding. Plan for discharge tomorrow   LOS: 1 day   [name] Other 02/17/2013, 7:26 AM

## 2013-02-17 NOTE — Anesthesia Preprocedure Evaluation (Signed)
Anesthesia Evaluation  Patient identified by MRN, date of birth, ID band Patient awake    Reviewed: Allergy & Precautions, H&P , NPO status , Patient's Chart, lab work & pertinent test results  Airway Mallampati: II TM Distance: >3 FB Neck ROM: Full    Dental  (+) Teeth Intact and Dental Advisory Given   Pulmonary neg pulmonary ROS,  breath sounds clear to auscultation        Cardiovascular negative cardio ROS  Rhythm:Regular Rate:Normal     Neuro/Psych negative neurological ROS  negative psych ROS   GI/Hepatic negative GI ROS, Neg liver ROS,   Endo/Other  Morbid obesity  Renal/GU negative Renal ROS     Musculoskeletal negative musculoskeletal ROS (+)   Abdominal   Peds  Hematology negative hematology ROS (+)   Anesthesia Other Findings   Reproductive/Obstetrics                           Anesthesia Physical  Anesthesia Plan  ASA: II  Anesthesia Plan: Epidural   Post-op Pain Management:    Induction:   Airway Management Planned:   Additional Equipment:   Intra-op Plan:   Post-operative Plan:   Informed Consent: I have reviewed the patients History and Physical, chart, labs and discussed the procedure including the risks, benefits and alternatives for the proposed anesthesia with the patient or authorized representative who has indicated his/her understanding and acceptance.   Dental advisory given  Plan Discussed with:   Anesthesia Plan Comments:         Anesthesia Quick Evaluation

## 2013-02-17 NOTE — Progress Notes (Addendum)
I spoke with and examined patient and agree with resident's note and plan of care, with the exception of pt has been NPO since 0400 in anticipation of BTL today. RN called me stating pt is not on OR schedule- Dr. Debroah Loop notified.  Voiding well.  Rt leg still feels somewhat numb from epidural. +flatus.  Lochia and pain wnl.  Denies dizziness, lightheadedness, or sob.  Breastfeeding well.  Cheral Marker, CNM, [facility] 02/17/2013 9:27 AM

## 2013-02-17 NOTE — Op Note (Signed)
Olivia Small 02/16/2013 - 02/17/2013  PREOPERATIVE DIAGNOSIS:  Multiparity, undesired fertility  POSTOPERATIVE DIAGNOSIS:  Multiparity, undesired fertility  PROCEDURE:  Postpartum Bilateral Tubal Sterilization using Filshie Clips   ANESTHESIA:  Epidural  COMPLICATIONS:  None immediate.  ESTIMATED BLOOD LOSS:  Less than 20 ml.  FLUIDS: 1000 ml LR.  URINE OUTPUT:  50 ml of clear urine.  INDICATIONS: 27 y.o. [id number]  with undesired fertility,status post vaginal delivery, desires permanent sterilization. Risks and benefits of procedure discussed with patient including permanence of method, bleeding, infection, injury to surrounding organs and need for additional procedures. Risk failure of 0.5-1% with increased risk of ectopic gestation if pregnancy occurs was also discussed with patient.   FINDINGS:  Normal uterus, tubes, and ovaries.  TECHNIQUE:  The patient was taken to the operating room where her epidural anesthesia was dosed up to surgical level and found to be adequate.  She was then placed in the dorsal supine position and prepped and draped in sterile fashion.  After an adequate timeout was performed, attention was turned to the patient's abdomen where a small transverse skin incision was made under the umbilical fold. The incision was taken down to the layer of fascia using the scalpel, and fascia was incised, and extended bilaterally using Mayo scissors. The peritoneum was entered in a sharp fashion. Attention was then turned to the patient's uterus, and left fallopian tube was identified and followed out to the fimbriated end.  A Filshie clip was placed on the left fallopian tube about 2 cm from the cornual attachment, with care given to incorporate the underlying mesosalpinx.  A similar process was carried out on the right side allowing for bilateral tubal sterilization.  Good hemostasis was noted overall.  Local analgesia was drizzled on both operative sites.The instruments were  then removed from the patient's abdomen and the fascial incision was repaired with 0 Vicryl, and the skin was closed with a 4-0 Vicryl subcuticular stitch. The patient tolerated the procedure well.  Sponge, lap, and needle counts were correct times two.  The patient was then taken to the recovery room awake, extubated and in stable condition.   Adam Phenix, MD 02/17/2013 2:56 PM

## 2013-02-17 NOTE — Progress Notes (Signed)
Patient ID: Olivia Small, female   DOB: 10-17-1985, 27 y.o.   MRN: [id number]  Patient requests postpartum BTL. Medicaid consent on chart. The procedure and the risk of anesthesia, bleeding, infection, bowel and bladder injury, failure (1/200) and ectopic pregnancy were discussed and her questions were answered. The procedure will be scheduled today 8 hr after last meal.  Adam Phenix, MD 02/17/2013 10:31 AM

## 2013-02-17 NOTE — OR Nursing (Signed)
Filshie clips applied to right and let fallopian tubes by Dr. Scheryl Darter on 02/17/2013. Lot number -[id number]. Expiration date 2016-10.  [employer].

## 2013-02-18 MED ORDER — IBUPROFEN 600 MG PO TABS: 600.0000 mg | ORAL_TABLET | Freq: Four times a day (QID) | ORAL | Status: DC | PRN

## 2013-02-18 NOTE — Clinical Social Work Note (Signed)
CSW consulted with MOB after RN request due to overhearing older child living with grandmother and unsure of living arrangement for other children.  CSW spoke with MOB and FOB at bedside.  MOB and FOB explained they have custody of all 4 children and oldest child is staying with MGM while MOB is in the hospital.  Please reconsult CSW if further needs arise.   [phone] 

## 2013-02-18 NOTE — Lactation Note (Signed)
This note was copied from the chart of Olivia Small. Lactation Consultation Note  Patient Name: Olivia Small HQION'G Date: 02/18/2013 Reason for consult: Initial assessment   Maternal Data Formula Feeding for Exclusion: Yes Reason for exclusion: Mother's choice to formula and breast feed on admission Infant to breast within first hour of birth: Yes Does the patient have breastfeeding experience prior to this delivery?: Yes  Feeding   LATCH Score/Interventions                      Lactation Tools Discussed/Used     Consult Status Consult Status: Complete  Experienced BF mom reports that baby has been nursing well but she has been giving some formula also. Baby in nursery at present because mom states "she had to get some rest". No questions at present. BF brochure given with resources for support after DC. To call prn  Pamelia Hoit 02/18/2013, 8:06 AM

## 2013-02-18 NOTE — Anesthesia Postprocedure Evaluation (Signed)
Anesthesia Post Note  Patient: Olivia Small  Procedure(s) Performed: Procedure(s) (LRB): POST PARTUM TUBAL LIGATION (Bilateral)  Anesthesia type: General  Patient location: Mother/Baby  Post pain: Pain level controlled  Post assessment: Post-op Vital signs reviewed  Last Vitals:  Filed Vitals:   02/18/13 0600  BP: 121/82  Pulse: 67  Temp: 36.3 C  Resp: 18    Post vital signs: Reviewed  Level of consciousness: sedated  Complications: No apparent anesthesia complications

## 2013-02-18 NOTE — Discharge Summary (Signed)
Obstetric Discharge Summary Reason for Admission: SOL w/ SROM Prenatal Procedures: ultrasound Intrapartum Procedures: spontaneous vaginal delivery Postpartum Procedures: P.P. tubal ligation Complications-Operative and Postpartum: none Eating, drinking, voiding, ambulating well.  +flatus.  Lochia and pain wnl.  Denies dizziness, lightheadedness, or sob. No complaints.   Hemoglobin  Date Value Range Status  02/17/2013 9.7* 12.0 - 15.0 g/dL Final     REPEATED TO VERIFY     DELTA CHECK NOTED     HCT  Date Value Range Status  02/17/2013 28.9* 36.0 - 46.0 % Final    Physical Exam:  General: alert, cooperative and no distress Lochia: appropriate Uterine Fundus: firm Incision: healing well, no significant drainage, no dehiscence, no significant erythema- dressing removed DVT Evaluation: No evidence of DVT seen on physical exam. Negative Homan's sign. No cords or calf tenderness. No significant calf/ankle edema.  Discharge Diagnoses: Term Pregnancy-delivered and PP BTL  Discharge Information: Date: 02/18/2013 Activity: pelvic rest Diet: routine Medications: PNV and Ibuprofen Condition: stable Instructions: refer to practice specific booklet Discharge to: home Follow-up Information   Follow up with [facility] In 6 weeks. (as scheduled for your postpartum visit)    Contact information:   [address] [place] [address] [phone]      Newborn Data: Live born female  Birth Weight: 8 lb (3629 g) APGAR: 8, 9  Home with mother. Breastfeeding  Marge Duncans 02/18/2013, 7:26 AM

## 2013-02-18 NOTE — H&P (Signed)
Attestation of Attending Supervision of Advanced Practitioner (CNM/NP): Evaluation and management procedures were performed by the Advanced Practitioner under my supervision and collaboration.  I have reviewed the Advanced Practitioner's note and chart, and I agree with the management and plan.  Braylon Lemmons 02/18/2013 8:26 AM   

## 2013-02-19 ENCOUNTER — Encounter (HOSPITAL_COMMUNITY): Payer: Self-pay | Admitting: Obstetrics & Gynecology

## 2013-02-19 LAB — TYPE AND SCREEN
Antibody Screen: POSITIVE
DAT, IgG: NEGATIVE
Unit division: 0

## 2013-02-19 NOTE — Progress Notes (Signed)
Post discharge chart review completed.  

## 2013-02-22 ENCOUNTER — Inpatient Hospital Stay (HOSPITAL_COMMUNITY): Admission: RE | Admit: 2013-02-22 | Payer: Medicaid Other | Source: Ambulatory Visit

## 2013-04-02 ENCOUNTER — Ambulatory Visit: Payer: Medicaid Other | Admitting: Obstetrics & Gynecology

## 2013-04-16 ENCOUNTER — Encounter: Payer: Self-pay | Admitting: *Deleted

## 2013-05-21 ENCOUNTER — Encounter: Payer: Self-pay | Admitting: Obstetrics & Gynecology

## 2014-07-29 ENCOUNTER — Encounter (HOSPITAL_COMMUNITY): Payer: Self-pay | Admitting: Obstetrics & Gynecology

## 2014-09-30 ENCOUNTER — Emergency Department (HOSPITAL_COMMUNITY)
Admission: EM | Admit: 2014-09-30 | Discharge: 2014-09-30 | Disposition: A | Discharge status: Home / Self Care | Payer: Medicaid Other | Attending: Emergency Medicine | Admitting: Emergency Medicine

## 2014-09-30 ENCOUNTER — Encounter (HOSPITAL_COMMUNITY): Payer: Self-pay | Admitting: Emergency Medicine

## 2014-09-30 ENCOUNTER — Emergency Department (HOSPITAL_COMMUNITY): Payer: Medicaid Other

## 2014-09-30 DIAGNOSIS — Z8619 Personal history of other infectious and parasitic diseases: Secondary | ICD-10-CM | POA: Insufficient documentation

## 2014-09-30 DIAGNOSIS — Z79899 Other long term (current) drug therapy: Secondary | ICD-10-CM | POA: Insufficient documentation

## 2014-09-30 DIAGNOSIS — R05 Cough: Secondary | ICD-10-CM

## 2014-09-30 DIAGNOSIS — R059 Cough, unspecified: Secondary | ICD-10-CM

## 2014-09-30 DIAGNOSIS — J40 Bronchitis, not specified as acute or chronic: Secondary | ICD-10-CM

## 2014-09-30 DIAGNOSIS — J209 Acute bronchitis, unspecified: Secondary | ICD-10-CM | POA: Insufficient documentation

## 2014-09-30 MED ORDER — BENZONATATE 100 MG PO CAPS: 100.0000 mg | ORAL_CAPSULE | Freq: Three times a day (TID) | ORAL | Status: DC

## 2014-09-30 MED ORDER — AZITHROMYCIN 250 MG PO TABS: ORAL_TABLET | ORAL | Status: DC

## 2014-09-30 NOTE — ED Notes (Addendum)
PT reports non-productive cough with chest congestion and tightness x 1 month. PT reports worsening in cough at night with decreased sleep. PT denies any fever.

## 2014-09-30 NOTE — ED Provider Notes (Signed)
CSN: [id number]     Arrival date & time 09/30/14  1636 History  This chart was scribed for non-physician practitioner, Kerrie Buffalo, NP working with Vida Roller, MD by Gwenyth Ober, ED scribe. This patient was seen in room APFT23/APFT23 and the patient's care was started at 5:19 PM   Chief Complaint  Patient presents with  . Cough   Patient is a 29 y.o. female presenting with cough. The history is provided by the patient. No language interpreter was used.  Cough Cough characteristics:  Non-productive Severity:  Moderate Onset quality:  Gradual Duration:  4 weeks Timing:  Intermittent Progression:  Worsening Chronicity:  Recurrent Smoker: no   Context: not occupational exposure and not sick contacts   Relieved by:  None tried Worsened by:  Nothing tried Ineffective treatments:  None tried Associated symptoms: no ear pain, no fever, no sore throat and no wheezing     HPI Comments: Olivia Small is a 29 y.o. female who presents to the Emergency Department complaining of an intermittent, gradually worsening unproductive cough that started 1 month ago. Pt notes chest congestion and tightness as an associated symptom. Pt has not tried any OTC medications. She states cough started as allergies, but has become worse over the last month. Pt reports that she has had the same cough for the last two years which has developed into pneumonia. She denies positive sick contacts and does not smoke cigarettes. Pt denies fever, ear pain, wheezing and sore throat as associated symptoms. Patient states she wants to get treated before it gets any worse.    Past Medical History  Diagnosis Date  . 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 No pertinent past medical history    Past Surgical History  Procedure Laterality Date  . Cholecystectomy  05/10/2012    Procedure: LAPAROSCOPIC CHOLECYSTECTOMY;  Surgeon: Fabio Bering, MD;  Location: [facility];   Service: General;  Laterality: N/A;  . Tubal ligation Bilateral 02/17/2013    Procedure: POST PARTUM TUBAL LIGATION;  Surgeon: Adam Phenix, MD;  Location: [facility];  Service: Gynecology;  Laterality: Bilateral;   Family History  Problem Relation Age of Onset  . Diabetes Father   . Cancer Sister     cervical   History  Substance Use Topics  . Smoking status: Never Smoker   . Smokeless tobacco: Not on file  . Alcohol Use: Yes     Comment: occassionally    OB History    Gravida Para Term Preterm AB TAB SAB Ectopic Multiple Living   0 0 0 0 0 0 4     Review of Systems  Constitutional: Negative for fever.  HENT: Positive for congestion. Negative for ear pain and sore throat.   Respiratory: Positive for cough and chest tightness. Negative for wheezing.   All other systems reviewed and are negative.  Allergies  Review of patient's allergies indicates no known allergies.  Home Medications   Prior to Admission medications   Medication Sig Start Date End Date Taking? Authorizing Provider  azithromycin (ZITHROMAX Z-PAK) 250 MG tablet Take 2 tablets today and then one tablet daily 09/30/14   [facility] Orlene Och, NP  benzonatate (TESSALON) 100 MG capsule Take 1 capsule (100 mg total) by mouth every 8 (eight) hours. 09/30/14   [facility] Orlene Och, NP  ibuprofen (ADVIL,MOTRIN) 600 MG tablet Take 1 tablet (600 mg total) by mouth every  6 (six) hours as needed for pain. 02/18/13   Marge Duncans, CNM  prenatal vitamin w/FE, FA (PRENATAL 1 + 1) 27-1 MG TABS Take 1 tablet by mouth daily at 12 noon.    Historical Provider, MD   BP 127/71 mmHg  Pulse 71  Temp(Src) 98.3 F (36.8 C) (Oral)  Resp 18  Ht  (1.676 m)  Wt 240 lb (108.863 kg)  BMI 38.76 kg/m2  SpO2 99%  LMP 09/27/2014 Physical Exam  Constitutional: She is oriented to person, place, and time. She appears well-developed and well-nourished. No distress.  HENT:  Head: Normocephalic and atraumatic.  Mouth/Throat: Oropharynx is  clear and moist. No oropharyngeal exudate.  Large tonsils, but no exudate; uvula midline; mild erythema  Eyes: Pupils are equal, round, and reactive to light.  Neck: Neck supple.  Cardiovascular: Normal rate, regular rhythm and normal heart sounds.   Pulmonary/Chest: Effort normal. No respiratory distress. She has no wheezes. Rhonchi: occasional. She has no rales.  Musculoskeletal: She exhibits no edema.  Neurological: She is alert and oriented to person, place, and time. No cranial nerve deficit.  Skin: Skin is warm and dry. No rash noted.  Psychiatric: She has a normal mood and affect. Her behavior is normal.  Nursing note and vitals reviewed.   ED Course  Procedures (including critical care time) DIAGNOSTIC STUDIES: Oxygen Saturation is 99% on RA, normal by my interpretation.    COORDINATION OF CARE: 5:23 PM Discussed treatment plan with pt which includes cough medicine and an anti-biotic. Pt agreed to plan.   Labs Review Labs Reviewed - No data to display  Imaging Review Dg Chest 2 View  09/30/2014   CLINICAL DATA:  Nonproductive cough. Chest congestion and tightness for 1 month. Symptoms more severe at night. No fever. Nonsmoker.  EXAM: CHEST  2 VIEW  COMPARISON:  12/13/2009  FINDINGS: The heart size and mediastinal contours are within normal limits. Both lungs are clear. The visualized skeletal structures are unremarkable.  IMPRESSION: No active cardiopulmonary disease.   Electronically Signed   By: Rosalie Gums M.D.   On: 09/30/2014 17:02    MDM  29 y.o. female with cough and congestion. Stable for d/c without fever or respiratory difficulty. Will treat for bronchitis and she will return as needed for worsening symptoms.   Final diagnoses:  Bronchitis   I personally performed the services described in this documentation, which was scribed in my presence. The recorded information has been reviewed and is accurate.    [address] [place], NP 09/30/14 2358  Vida Roller,  MD 10/01/14 [phone]

## 2014-09-30 NOTE — Discharge Instructions (Signed)
Cool Mist Vaporizers  Vaporizers may help relieve the symptoms of a cough and cold. They add moisture to the air, which helps mucus to become thinner and less sticky. This makes it easier to breathe and cough up secretions. Cool mist vaporizers do not cause serious burns like hot mist vaporizers, which may also be called steamers or humidifiers. Vaporizers have not been proven to help with colds. You should not use a vaporizer if you are allergic to mold.  HOME CARE INSTRUCTIONS  · Follow the package instructions for the vaporizer.  · Do not use anything other than distilled water in the vaporizer.  · Do not run the vaporizer all of the time. This can cause mold or bacteria to grow in the vaporizer.  · Clean the vaporizer after each time it is used.  · Clean and dry the vaporizer well before storing it.  · Stop using the vaporizer if worsening respiratory symptoms develop.  Document Released: 06/10/2004 Document Revised: 09/18/2013 Document Reviewed: 01/31/2013  ExitCare® Patient Information ©2015 [employer]. This information is not intended to replace advice given to you by your health care provider. Make sure you discuss any questions you have with your health care provider.  Cough, Adult   A cough is a reflex that helps clear your throat and airways. It can help heal the body or may be a reaction to an irritated airway. A cough may only last 2 or 3 weeks (acute) or may last more than 8 weeks (chronic).   CAUSES  Acute cough:  · Viral or bacterial infections.  Chronic cough:  · Infections.  · Allergies.  · Asthma.  · Post-nasal drip.  · Smoking.  · Heartburn or acid reflux.  · Some medicines.  · Chronic lung problems (COPD).  · Cancer.  SYMPTOMS   · Cough.  · Fever.  · Chest pain.  · Increased breathing rate.  · High-pitched whistling sound when breathing (wheezing).  · Colored mucus that you cough up (sputum).  TREATMENT   · A bacterial cough may be treated with antibiotic medicine.  · A viral cough must run  its course and will not respond to antibiotics.  · Your caregiver may recommend other treatments if you have a chronic cough.  HOME CARE INSTRUCTIONS   · Only take over-the-counter or prescription medicines for pain, discomfort, or fever as directed by your caregiver. Use cough suppressants only as directed by your caregiver.  · Use a cold steam vaporizer or humidifier in your bedroom or home to help loosen secretions.  · Sleep in a semi-upright position if your cough is worse at night.  · Rest as needed.  · Stop smoking if you smoke.  SEEK IMMEDIATE MEDICAL CARE IF:   · You have pus in your sputum.  · Your cough starts to worsen.  · You cannot control your cough with suppressants and are losing sleep.  · You begin coughing up blood.  · You have difficulty breathing.  · You develop pain which is getting worse or is uncontrolled with medicine.  · You have a fever.  MAKE SURE YOU:   · Understand these instructions.  · Will watch your condition.  · Will get help right away if you are not doing well or get worse.  Document Released: 03/12/2011 Document Revised: 12/06/2011 Document Reviewed: 03/12/2011  ExitCare® Patient Information ©2015 [employer]. This information is not intended to replace advice given to you by your health care provider. Make sure you discuss   any questions you have with your health care provider.

## 2016-02-10 ENCOUNTER — Emergency Department (HOSPITAL_COMMUNITY)
Admission: EM | Admit: 2016-02-10 | Discharge: 2016-02-10 | Disposition: A | Discharge status: Home / Self Care | Payer: Medicaid Other | Attending: Emergency Medicine | Admitting: Emergency Medicine

## 2016-02-10 ENCOUNTER — Encounter (HOSPITAL_COMMUNITY): Payer: Self-pay

## 2016-02-10 DIAGNOSIS — Z79899 Other long term (current) drug therapy: Secondary | ICD-10-CM | POA: Insufficient documentation

## 2016-02-10 DIAGNOSIS — Y999 Unspecified external cause status: Secondary | ICD-10-CM | POA: Insufficient documentation

## 2016-02-10 DIAGNOSIS — X58XXXA Exposure to other specified factors, initial encounter: Secondary | ICD-10-CM | POA: Insufficient documentation

## 2016-02-10 DIAGNOSIS — Y929 Unspecified place or not applicable: Secondary | ICD-10-CM | POA: Insufficient documentation

## 2016-02-10 DIAGNOSIS — Y939 Activity, unspecified: Secondary | ICD-10-CM | POA: Insufficient documentation

## 2016-02-10 DIAGNOSIS — S39012A Strain of muscle, fascia and tendon of lower back, initial encounter: Secondary | ICD-10-CM | POA: Insufficient documentation

## 2016-02-10 MED ORDER — CYCLOBENZAPRINE HCL 10 MG PO TABS: 10.0000 mg | ORAL_TABLET | Freq: Two times a day (BID) | ORAL | Status: DC | PRN

## 2016-02-10 NOTE — ED Notes (Signed)
Pt c/o pain in lower back that has gotten worse over the past week.  Denies injury.

## 2016-02-10 NOTE — Discharge Instructions (Signed)
Continue to take the ibuprofen. Do not take the muscle relaxant if driving as it will make you sleepy. Follow up with DR. Eulah PontMurphy if symptoms persist.

## 2016-02-10 NOTE — ED Provider Notes (Signed)
CSN: [id number]     Arrival date & time 02/10/16  1527 History   First MD Initiated Contact with Patient 02/10/16 1608     Chief Complaint  Patient presents with  . Back Pain     (Consider location/radiation/quality/duration/timing/severity/associated sxs/prior Treatment) Patient is a 30 y.o. female presenting with back pain. The history is provided by the patient. No language interpreter was used.  Back Pain Location:  Lumbar spine Quality:  Aching Radiates to:  Does not radiate Pain severity:  Moderate Pain is:  Same all the time Onset quality:  Gradual Duration:  1 week Timing:  Constant Progression:  Unchanged Chronicity:  New Context comment:  Unsusre Relieved by:  Nothing Worsened by:  Movement and ambulation Associated symptoms: no bladder incontinence, no bowel incontinence and no dysuria    Olivia Small is a 30 y.o. female who presents to the ED with low back pain that started a week ago and has continued. 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   Past Medical History  Diagnosis Date  . 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 No pertinent past medical history    Past Surgical History  Procedure Laterality Date  . Cholecystectomy  05/10/2012    Procedure: LAPAROSCOPIC CHOLECYSTECTOMY;  Surgeon: Fabio BeringBrent C Ziegler, MD;  Location: [facility];  Service: General;  Laterality: N/A;  . Tubal ligation Bilateral 02/17/2013    Procedure: POST PARTUM TUBAL LIGATION;  Surgeon: Adam PhenixJames G Arnold, MD;  Location: [facility];  Service: Gynecology;  Laterality: Bilateral;   Family History  Problem Relation Age of Onset  . Diabetes Father   . Cancer Sister     cervical   Social History  Substance Use Topics  . Smoking status: Never Smoker   . Smokeless tobacco: None  . Alcohol Use: Yes     Comment: occassionally    OB History    Gravida Para Term Preterm AB  TAB SAB Ectopic Multiple Living   4 4 4  0 0 0 0 0 0 4     Review of Systems  Gastrointestinal: Negative for bowel incontinence.  Genitourinary: Negative for bladder incontinence and dysuria.  Musculoskeletal: Positive for back pain.  all other systems negative    Allergies  Review of patient's allergies indicates no known allergies.  Home Medications   Prior to Admission medications   Medication Sig Start Date End Date Taking? Authorizing Provider  azithromycin (ZITHROMAX Z-PAK) 250 MG tablet Take 2 tablets today and then one tablet daily 09/30/14   [facility] Orlene OchM Neese, NP  benzonatate (TESSALON) 100 MG capsule Take 1 capsule (100 mg total) by mouth every 8 (eight) hours. 09/30/14   [facility] Orlene OchM Neese, NP  cyclobenzaprine (FLEXERIL) 10 MG tablet Take 1 tablet (10 mg total) by mouth 2 (two) times daily as needed for muscle spasms. 02/10/16   [facility] Orlene OchM Neese, NP  ibuprofen (ADVIL,MOTRIN) 600 MG tablet Take 1 tablet (600 mg total) by mouth every 6 (six) hours as needed for pain. 02/18/13   Cheral MarkerKimberly R Booker, CNM  prenatal vitamin w/FE, FA (PRENATAL 1 + 1) 27-1 MG TABS Take 1 tablet by mouth daily at 12 noon.    Historical Provider, MD   BP 135/78 mmHg  Pulse 86  Temp(Src) 98.8 F (37.1 C) (Oral)  Resp 20  Ht 5\' 6"  (1.676 m)  Wt 112.492 kg  BMI 40.05 kg/m2  SpO2 100%  LMP 02/10/2016 Physical Exam  Constitutional: She is oriented to person, place, and time. She appears well-developed and well-nourished. No distress.  HENT:  Head: Normocephalic and atraumatic.  Nose: Nose normal.  Eyes: EOM are normal.  Neck: Normal range of motion. Neck supple.  Cardiovascular: Normal rate and regular rhythm.   Pulmonary/Chest: Effort normal. She has no wheezes. She has no rales.  Abdominal: Soft. Bowel sounds are normal. There is no tenderness.  Musculoskeletal: Normal range of motion.       Lumbar back: She exhibits tenderness, pain and spasm. She exhibits normal pulse.  Neurological: She is alert and oriented  to person, place, and time. She has normal strength. No cranial nerve deficit or sensory deficit. Gait normal.  Reflex Scores:      Bicep reflexes are 2+ on the right side and 2+ on the left side.      Brachioradialis reflexes are 2+ on the right side and 2+ on the left side.      Patellar reflexes are 2+ on the right side and 2+ on the left side.      Achilles reflexes are 2+ on the right side and 2+ on the left side. Skin: Skin is warm and dry.  Psychiatric: She has a normal mood and affect. Her behavior is normal.  Nursing note and vitals reviewed.   ED Course  Procedures (including critical care time) Labs Review Labs Reviewed - No data to display   MDM  30 y.o. female with low back pain stable for d/c without focal neuro deficits, no red flags to indicate need for immediate neuro consult. Will treat with muscle relaxants and she will continue ibuprofen. Ortho referral and instructions for f/u for worsening symptoms. Patient voices understanding and agrees with plan. Work note given.   Final diagnoses:  Lumbosacral strain, initial encounter       [facility], NP 02/10/16 1703  Bethann Berkshire, MD 02/10/16 [phone]

## 2016-06-07 ENCOUNTER — Encounter (HOSPITAL_COMMUNITY): Payer: Self-pay

## 2016-06-07 ENCOUNTER — Emergency Department (HOSPITAL_COMMUNITY)
Admission: EM | Admit: 2016-06-07 | Discharge: 2016-06-07 | Disposition: A | Discharge status: Home / Self Care | Payer: Medicaid Other | Attending: Emergency Medicine | Admitting: Emergency Medicine

## 2016-06-07 DIAGNOSIS — N390 Urinary tract infection, site not specified: Secondary | ICD-10-CM | POA: Insufficient documentation

## 2016-06-07 LAB — URINE MICROSCOPIC-ADD ON

## 2016-06-07 LAB — URINALYSIS, ROUTINE W REFLEX MICROSCOPIC
BILIRUBIN URINE: NEGATIVE
Glucose, UA: NEGATIVE mg/dL
Ketones, ur: NEGATIVE mg/dL
NITRITE: NEGATIVE
PH: 5.5 (ref 5.0–8.0)
Protein, ur: NEGATIVE mg/dL
SPECIFIC GRAVITY, URINE: 1.01 (ref 1.005–1.030)

## 2016-06-07 LAB — PREGNANCY, URINE: PREG TEST UR: NEGATIVE

## 2016-06-07 MED ORDER — ONDANSETRON HCL 4 MG PO TABS
4.0000 mg | ORAL_TABLET | Freq: Once | ORAL | Status: AC
  Administered 2016-06-07: 4 mg via ORAL
  Filled 2016-06-07: qty 1

## 2016-06-07 MED ORDER — CEPHALEXIN 500 MG PO CAPS: 500.0000 mg | ORAL_CAPSULE | Freq: Four times a day (QID) | ORAL | 0 refills | Status: DC

## 2016-06-07 MED ORDER — IBUPROFEN 800 MG PO TABS
800.0000 mg | ORAL_TABLET | Freq: Once | ORAL | Status: AC
  Administered 2016-06-07: 800 mg via ORAL
  Filled 2016-06-07: qty 1

## 2016-06-07 MED ORDER — PHENAZOPYRIDINE HCL 100 MG PO TABS
100.0000 mg | ORAL_TABLET | Freq: Once | ORAL | Status: AC
  Administered 2016-06-07: 100 mg via ORAL
  Filled 2016-06-07: qty 1

## 2016-06-07 MED ORDER — CEPHALEXIN 500 MG PO CAPS
500.0000 mg | ORAL_CAPSULE | Freq: Once | ORAL | Status: AC
  Administered 2016-06-07: 500 mg via ORAL
  Filled 2016-06-07: qty 1

## 2016-06-07 MED ORDER — PHENAZOPYRIDINE HCL 100 MG PO TABS: 100.0000 mg | ORAL_TABLET | Freq: Three times a day (TID) | ORAL | 0 refills | Status: DC | PRN

## 2016-06-07 NOTE — Discharge Instructions (Signed)
Your exam suggest urinary tract infection. Please increase fluids. Use keflex four times daily with food. Use pyridium three times daily for urinary pain and spasm.Please see your Medicaid Access MD for recheck of the urine in 7 to 10 days.

## 2016-06-07 NOTE — ED Provider Notes (Signed)
[facility] Provider Note   CSN: [id number] Arrival date & time: 06/07/16  2054     History   Chief Complaint Chief Complaint  Patient presents with  . Dysuria    HPI Olivia Small is a 30 y.o. female.  The history is provided by the patient.  Dysuria   This is a new problem. The current episode started more than 1 week ago. The problem occurs intermittently. The problem has been gradually worsening. The quality of the pain is described as aching. The pain is at a severity of 4/10. The pain is moderate. There has been no fever. She is not sexually active. There is no history of pyelonephritis. Associated symptoms include frequency, hematuria and urgency. Pertinent negatives include no chills, no nausea, no vomiting and no flank pain. She has tried nothing for the symptoms. Her past medical history does not include kidney stones, single kidney, recurrent UTIs or catheterization.    Past Medical History:  Diagnosis Date  . 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 No pertinent past medical history     Patient Active Problem List   Diagnosis Date Noted  . Rh negative state in antepartum period 01/29/2013  . Rapid first stage of labor 01/29/2013  . Supervision of other normal pregnancy 01/08/2013  . 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 CHOLECYSTECTOMY  05/10/2012   Procedure: LAPAROSCOPIC CHOLECYSTECTOMY;  Surgeon: Fabio BeringBrent C Ziegler, MD;  Location: [facility];  Service: General;  Laterality: N/A;  . TUBAL LIGATION Bilateral 02/17/2013   Procedure: POST PARTUM TUBAL LIGATION;  Surgeon: Adam PhenixJames G Arnold, MD;  Location: [facility];  Service: Gynecology;  Laterality: Bilateral;    OB History    Gravida Para Term Preterm AB Living   4 4 4  0 0 4   SAB TAB Ectopic Multiple Live Births   0 0 0 0 2       Home Medications    Prior to Admission medications   Not on File    Family  History Family History  Problem Relation Age of Onset  . Diabetes Father   . Cancer Sister     cervical    Social History Social History  Substance Use Topics  . Smoking status: Never Smoker  . Smokeless tobacco: Never Used  . Alcohol use Yes     Comment: occassionally      Allergies   Review of patient's allergies indicates no known allergies.   Review of Systems Review of Systems  Constitutional: Negative for activity change, appetite change and chills.  Gastrointestinal: Negative for nausea and vomiting.  Genitourinary: Positive for dysuria, frequency, hematuria and urgency. Negative for flank pain.  All other systems reviewed and are negative.    Physical Exam Updated Vital Signs BP 129/76 (BP Location: Left Arm)   Pulse 80   Temp 99 F (37.2 C) (Oral)   Resp 16   Ht 5\' 6"  (1.676 m)   Wt 111.1 kg   LMP 05/31/2016   SpO2 99%   BMI 39.54 kg/m   Physical Exam  Constitutional: She is oriented to person, place, and time. She appears well-developed and well-nourished.  Non-toxic appearance.  HENT:  Head: Normocephalic.  Right Ear: Tympanic membrane and external ear normal.  Left Ear: Tympanic membrane and external ear normal.  Eyes: EOM and lids are normal. Pupils are equal, round, and reactive to light.  Neck: Normal range of motion. Neck supple. Carotid bruit is not present.  Cardiovascular: Normal rate, regular rhythm, normal heart sounds, intact distal pulses and normal pulses.   Pulmonary/Chest: Breath sounds normal. No respiratory distress.  Abdominal: Soft. Bowel sounds are normal. There is tenderness in the suprapubic area. There is no guarding.  No CVAT  Musculoskeletal: Normal range of motion.  Lymphadenopathy:       Head (right side): No submandibular adenopathy present.       Head (left side): No submandibular adenopathy present.    She has no cervical adenopathy.  Neurological: She is alert and oriented to person, place, and time. She has  normal strength. No cranial nerve deficit or sensory deficit.  Skin: Skin is warm and dry.  Psychiatric: She has a normal mood and affect. Her speech is normal.  Nursing note and vitals reviewed.    ED Treatments / Results  Labs (all labs ordered are listed, but only abnormal results are displayed) Labs Reviewed  URINALYSIS, ROUTINE W REFLEX MICROSCOPIC (NOT AT [facility]) - Abnormal; Notable for the following:       Result Value   Hgb urine dipstick LARGE (*)    Leukocytes, UA MODERATE (*)    All other components within normal limits  URINE MICROSCOPIC-ADD ON - Abnormal; Notable for the following:    Squamous Epithelial / LPF 0-5 (*)    Bacteria, UA FEW (*)    All other components within normal limits  PREGNANCY, URINE    EKG  EKG Interpretation None       Radiology No results found.  Procedures Procedures (including critical care time)  Medications Ordered in ED Medications - No data to display   Initial Impression / Assessment and Plan / ED Course  I have reviewed the triage vital signs and the nursing notes.  Pertinent labs & imaging results that were available during my care of the patient were reviewed by me and considered in my medical decision making (see chart for details).  Clinical Course    **I have reviewed nursing notes, vital signs, and all appropriate lab and imaging results for this patient.*  Final Clinical Impressions(s) / ED Diagnoses  Vital signs reviewed. Culture sent to the lab. Rx for keflex and pyridium given to the patient. She will have urine rechecked in 7 to 10 days. No evidence for kidney stone. No evidence for pyelo.   Final diagnoses:  None    New Prescriptions New Prescriptions   No medications on file     Ivery Quale, PA-C 06/07/16 2314    Layla Maw Ward, DO 06/07/16 2324

## 2016-06-07 NOTE — ED Triage Notes (Signed)
Patient c/o pain with urination and blood in urine X1 week

## 2016-06-09 LAB — URINE CULTURE: Culture: <10000 — AB

## 2016-07-05 ENCOUNTER — Encounter (HOSPITAL_COMMUNITY): Payer: Self-pay | Admitting: Emergency Medicine

## 2016-07-05 ENCOUNTER — Emergency Department (HOSPITAL_COMMUNITY)
Admission: EM | Admit: 2016-07-05 | Discharge: 2016-07-05 | Disposition: A | Discharge status: Home / Self Care | Payer: Medicaid Other | Attending: Emergency Medicine | Admitting: Emergency Medicine

## 2016-07-05 DIAGNOSIS — J029 Acute pharyngitis, unspecified: Secondary | ICD-10-CM

## 2016-07-05 DIAGNOSIS — Z79899 Other long term (current) drug therapy: Secondary | ICD-10-CM | POA: Insufficient documentation

## 2016-07-05 MED ORDER — IBUPROFEN 100 MG/5ML PO SUSP: 400.0000 mg | Freq: Four times a day (QID) | ORAL | 1 refills | Status: DC | PRN

## 2016-07-05 MED ORDER — PENICILLIN G BENZATHINE 1200000 UNIT/2ML IM SUSP
1.2000 10*6.[IU] | Freq: Once | INTRAMUSCULAR | Status: AC
Start: 2016-07-05 — End: 2016-07-05
  Administered 2016-07-05: 1.2 10*6.[IU] via INTRAMUSCULAR
  Filled 2016-07-05: qty 2

## 2016-07-05 MED ORDER — IBUPROFEN 100 MG/5ML PO SUSP
400.0000 mg | Freq: Once | ORAL | Status: AC
  Administered 2016-07-05: 400 mg via ORAL
  Filled 2016-07-05: qty 20

## 2016-07-05 MED ORDER — PROMETHAZINE-CODEINE 6.25-10 MG/5ML PO SYRP: 10.0000 mL | ORAL_SOLUTION | Freq: Four times a day (QID) | ORAL | 0 refills | Status: DC | PRN

## 2016-07-05 NOTE — ED Triage Notes (Signed)
Pt reports sore throat since Thursday and bilateral ear pain. Pt alert and oriented speaking in full sentences.

## 2016-07-05 NOTE — ED Provider Notes (Signed)
[facility] Provider Note   CSN: [id number] Arrival date & time: 07/05/16  1120     History   Chief Complaint Chief Complaint  Patient presents with  . Sore Throat    HPI Olivia Small is a 30 y.o. female.  Patient is a 30 year old female who presents to the emergency department with a complaint of sore throat.  The patient gives a four-day history of increasing sore throat, and now on pain in both tears. The patient denies any unusual rash. She states she has difficulty with swallowing. She has not had any problem with speaking. She has had a few chills, but states that she otherwise has been okay. She has not measured a temperature elevation. His been no recent operations or procedures involving the throat area. No unusual headache or abdominal pain. No vomiting or diarrhea.   The history is provided by the patient.  Sore Throat  Pertinent negatives include no chest pain, no abdominal pain and no shortness of breath.    Past Medical History:  Diagnosis Date  . 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 No pertinent past medical history     Patient Active Problem List   Diagnosis Date Noted  . Rh negative state in antepartum period 01/29/2013  . Rapid first stage of labor 01/29/2013  . Supervision of other normal pregnancy 01/08/2013  . 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 CHOLECYSTECTOMY  05/10/2012   Procedure: LAPAROSCOPIC CHOLECYSTECTOMY;  Surgeon: Fabio BeringBrent C Ziegler, MD;  Location: [facility];  Service: General;  Laterality: N/A;  . TUBAL LIGATION Bilateral 02/17/2013   Procedure: POST PARTUM TUBAL LIGATION;  Surgeon: Adam PhenixJames G Arnold, MD;  Location: [facility];  Service: Gynecology;  Laterality: Bilateral;    OB History    Gravida Para Term Preterm AB Living   4 4 4  0 0 4   SAB TAB Ectopic Multiple Live Births   0 0 0 0 2       Home Medications    Prior to  Admission medications   Medication Sig Start Date End Date Taking? Authorizing Provider  cephALEXin (KEFLEX) 500 MG capsule Take 1 capsule (500 mg total) by mouth 4 (four) times daily. 06/07/16   Ivery QualeHobson Ritvik Mczeal, PA-C  phenazopyridine (PYRIDIUM) 100 MG tablet Take 1 tablet (100 mg total) by mouth 3 (three) times daily as needed for pain. Take with food 06/07/16   Ivery QualeHobson Stephanie Littman, PA-C    Family History Family History  Problem Relation Age of Onset  . Diabetes Father   . Cancer Sister     cervical    Social History Social History  Substance Use Topics  . Smoking status: Never Smoker  . Smokeless tobacco: Never Used  . Alcohol use Yes     Comment: occassionally      Allergies   Review of patient's allergies indicates no known allergies.   Review of Systems Review of Systems  Constitutional: Positive for chills. Negative for activity change and fever.       All ROS Neg except as noted in HPI  HENT: Positive for ear pain and sore throat. Negative for nosebleeds.   Eyes: Negative for photophobia and discharge.  Respiratory: Negative for cough, shortness of breath and wheezing.   Cardiovascular: Negative for chest pain and palpitations.  Gastrointestinal: Negative for abdominal pain and blood in stool.  Genitourinary: Negative for dysuria, frequency and  hematuria.  Musculoskeletal: Negative for arthralgias, back pain and neck pain.  Skin: Negative.   Neurological: Negative for dizziness, seizures and speech difficulty.  Psychiatric/Behavioral: Negative for confusion and hallucinations.     Physical Exam Updated Vital Signs BP 126/81   Pulse 103   Temp 98.7 F (37.1 C) (Oral)   Resp 16   Ht 5\' 6"  (1.676 m)   Wt 108.9 kg   LMP 06/25/2016 (Exact Date)   SpO2 100%   BMI 38.74 kg/m   Physical Exam  Constitutional: She appears well-developed and well-nourished. No distress.  HENT:  Head: Normocephalic and atraumatic.  Right Ear: External ear normal.  Left Ear: External  ear normal.  There is increased redness of the posterior pharynx. There is exudate on the tonsil areas. The uvula is in the midline, but swollen. The airway is patent.  Eyes: Conjunctivae are normal. Right eye exhibits no discharge. Left eye exhibits no discharge. No scleral icterus.  Neck: Neck supple. No tracheal deviation present.  Cardiovascular: Regular rhythm and intact distal pulses.  Tachycardia present.   Pulmonary/Chest: Effort normal and breath sounds normal. No stridor. No respiratory distress. She has no wheezes. She has no rales.  Abdominal: Soft. Bowel sounds are normal. She exhibits no distension. There is no tenderness. There is no rebound and no guarding.  Musculoskeletal: She exhibits no edema or tenderness.  Neurological: She is alert. She has normal strength. No cranial nerve deficit (no facial droop, extraocular movements intact, no slurred speech) or sensory deficit. She exhibits normal muscle tone. She displays no seizure activity. Coordination normal.  Skin: Skin is warm and dry. No rash noted.  Psychiatric: She has a normal mood and affect.  Nursing note and vitals reviewed.    ED Treatments / Results  Labs (all labs ordered are listed, but only abnormal results are displayed) Labs Reviewed - No data to display  EKG  EKG Interpretation None       Radiology No results found.  Procedures Procedures (including critical care time)  Medications Ordered in ED Medications  penicillin g benzathine (BICILLIN LA) 1200000 UNIT/2ML injection 1.2 Million Units (not administered)  ibuprofen (ADVIL,MOTRIN) 100 MG/5ML suspension 400 mg (not administered)     Initial Impression / Assessment and Plan / ED Course  I have reviewed the triage vital signs and the nursing notes.  Pertinent labs & imaging results that were available during my care of the patient were reviewed by me and considered in my medical decision making (see chart for details).  Clinical Course     **I have reviewed nursing notes, vital signs, and all appropriate lab and imaging results for this patient.*  Final Clinical Impressions(s) / ED Diagnoses  Vital signs reviewed. The examination is consistent with acute pharyngitis. Patient treated in the emergency department with Bicillin and ibuprofen. Prescription will be given for promethazine codeine for pain. Patient is to follow-up with her Medicaid access physician if not improving.    Final diagnoses:  None    New Prescriptions New Prescriptions   No medications on file     Ivery Quale, PA-C 07/05/16 1409    Blane Ohara, MD 07/06/16 [id number]

## 2016-07-05 NOTE — Discharge Instructions (Signed)
° °   Salt water gargles maybe helpful. Chloraseptic spray may also be helpful. Use ibuprofen every 6 hours for mild pain, use promethazine codeine for more severe pain.This medication may cause drowsiness. Please do not drink, drive, or participate in activity that requires concentration while taking this medication.

## 2017-07-05 ENCOUNTER — Other Ambulatory Visit: Payer: Self-pay | Admitting: Advanced Practice Midwife

## 2017-12-13 ENCOUNTER — Encounter (HOSPITAL_COMMUNITY): Payer: Self-pay | Admitting: Emergency Medicine

## 2017-12-13 ENCOUNTER — Other Ambulatory Visit: Payer: Self-pay

## 2017-12-13 ENCOUNTER — Emergency Department (HOSPITAL_COMMUNITY)
Admission: EM | Admit: 2017-12-13 | Discharge: 2017-12-13 | Disposition: A | Discharge status: Home / Self Care | Payer: Medicaid Other | Attending: Emergency Medicine | Admitting: Emergency Medicine

## 2017-12-13 ENCOUNTER — Emergency Department (HOSPITAL_COMMUNITY): Payer: Medicaid Other

## 2017-12-13 DIAGNOSIS — J329 Chronic sinusitis, unspecified: Secondary | ICD-10-CM

## 2017-12-13 DIAGNOSIS — J209 Acute bronchitis, unspecified: Secondary | ICD-10-CM | POA: Insufficient documentation

## 2017-12-13 DIAGNOSIS — J018 Other acute sinusitis: Secondary | ICD-10-CM | POA: Diagnosis not present

## 2017-12-13 DIAGNOSIS — R05 Cough: Secondary | ICD-10-CM | POA: Diagnosis present

## 2017-12-13 MED ORDER — ALBUTEROL SULFATE HFA 108 (90 BASE) MCG/ACT IN AERS
2.0000 | INHALATION_SPRAY | Freq: Once | RESPIRATORY_TRACT | Status: AC
  Administered 2017-12-13: 2 via RESPIRATORY_TRACT
  Filled 2017-12-13: qty 6.7

## 2017-12-13 MED ORDER — DEXAMETHASONE 4 MG PO TABS: 4.0000 mg | ORAL_TABLET | Freq: Two times a day (BID) | ORAL | 0 refills | Status: DC

## 2017-12-13 MED ORDER — HYDROCODONE-HOMATROPINE 5-1.5 MG/5ML PO SYRP: 5.0000 mL | ORAL_SOLUTION | Freq: Four times a day (QID) | ORAL | 0 refills | Status: DC | PRN

## 2017-12-13 MED ORDER — ONDANSETRON HCL 4 MG PO TABS
4.0000 mg | ORAL_TABLET | Freq: Once | ORAL | Status: AC
  Administered 2017-12-13: 4 mg via ORAL
  Filled 2017-12-13: qty 1

## 2017-12-13 MED ORDER — IBUPROFEN 800 MG PO TABS
800.0000 mg | ORAL_TABLET | Freq: Once | ORAL | Status: AC
  Administered 2017-12-13: 800 mg via ORAL
  Filled 2017-12-13: qty 1

## 2017-12-13 MED ORDER — PREDNISONE 20 MG PO TABS
40.0000 mg | ORAL_TABLET | Freq: Once | ORAL | Status: AC
  Administered 2017-12-13: 40 mg via ORAL
  Filled 2017-12-13: qty 2

## 2017-12-13 MED ORDER — PSEUDOEPHEDRINE HCL 60 MG PO TABS
60.0000 mg | ORAL_TABLET | Freq: Once | ORAL | Status: AC
  Administered 2017-12-13: 60 mg via ORAL
  Filled 2017-12-13: qty 1

## 2017-12-13 MED ORDER — IBUPROFEN 600 MG PO TABS: 600.0000 mg | ORAL_TABLET | Freq: Four times a day (QID) | ORAL | 0 refills | Status: DC

## 2017-12-13 NOTE — ED Triage Notes (Signed)
Pt c/o of cough and congestion x2 months but states this past week, her cough and symptoms have been getting worse.

## 2017-12-13 NOTE — ED Provider Notes (Signed)
[facility] EMERGENCY DEPARTMENT Provider Note   CSN: [id number] Arrival date & time: 12/13/17  1825     History   Chief Complaint Chief Complaint  Patient presents with  . Cough    HPI Olivia Small is a 32 y.o. female.  The history is provided by the patient.  Cough  This is a recurrent problem. Episode onset: onset 2 months ago, worse in the past week. The problem occurs every few minutes. The problem has been gradually worsening. The cough is non-productive. There has been no fever. Associated symptoms include headaches, rhinorrhea and myalgias. Pertinent negatives include no chest pain, no sweats, no shortness of breath and no wheezing. She has tried cough syrup for the symptoms. The treatment provided no relief. She is not a smoker. Her past medical history is significant for bronchitis. Her past medical history does not include pneumonia, emphysema or asthma.    Past Medical History:  Diagnosis Date  . 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 No pertinent past medical history     Patient Active Problem List   Diagnosis Date Noted  . Rh negative state in antepartum period 01/29/2013  . Rapid first stage of labor 01/29/2013  . Supervision of other normal pregnancy 01/08/2013  . 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 CHOLECYSTECTOMY  05/10/2012   Procedure: LAPAROSCOPIC CHOLECYSTECTOMY;  Surgeon: Fabio BeringBrent C Ziegler, MD;  Location: [facility];  Service: General;  Laterality: N/A;  . TUBAL LIGATION Bilateral 02/17/2013   Procedure: POST PARTUM TUBAL LIGATION;  Surgeon: Adam PhenixJames G Arnold, MD;  Location: [facility];  Service: Gynecology;  Laterality: Bilateral;    OB History    Gravida Para Term Preterm AB Living   4 4 4  0 0 4   SAB TAB Ectopic Multiple Live Births   0 0 0 0 2       Home Medications    Prior to Admission medications   Medication Sig Start Date End Date Taking?  Authorizing Provider  Pseudoeph-Doxylamine-DM-APAP (NYQUIL MULTI-SYMPTOM PO) Take 30 mLs by mouth at bedtime.   Yes [provider]    Family History Family History  Problem Relation Age of Onset  . Diabetes Father   . Cancer Sister        cervical    Social History Social History   Tobacco Use  . Smoking status: Never Smoker  . Smokeless tobacco: Never Used  Substance Use Topics  . Alcohol use: Yes    Comment: occassionally   . Drug use: No     Allergies   Patient has no known allergies.   Review of Systems Review of Systems  Constitutional: Negative for activity change.       All ROS Neg except as noted in HPI  HENT: Positive for congestion and rhinorrhea. Negative for nosebleeds.   Eyes: Negative for photophobia and discharge.  Respiratory: Positive for cough. Negative for shortness of breath and wheezing.   Cardiovascular: Negative for chest pain and palpitations.  Gastrointestinal: Negative for abdominal pain and blood in stool.  Genitourinary: Negative for dysuria, frequency and hematuria.  Musculoskeletal: Positive for myalgias. Negative for arthralgias, back pain and neck pain.  Skin: Negative.   Neurological: Positive for headaches. Negative for dizziness, seizures and speech difficulty.  Psychiatric/Behavioral: Negative for confusion and hallucinations.     Physical Exam Updated Vital Signs BP (!) 127/103 (BP Location: Right  Arm)   Pulse 88   Temp 97.7 F (36.5 C) (Temporal)   Resp 18   Ht 5\' 6"  (1.676 m)   Wt 108.4 kg (239 lb)   SpO2 99%   BMI 38.58 kg/m   Physical Exam  Constitutional: She is oriented to person, place, and time. She appears well-developed and well-nourished.  Non-toxic appearance.  HENT:  Head: Normocephalic.  Right Ear: Tympanic membrane and external ear normal.  Left Ear: Tympanic membrane and external ear normal.  Nasal congestion present.  Eyes: EOM and lids are normal. Pupils are equal, round, and reactive to  light.  Neck: Normal range of motion. Neck supple. Carotid bruit is not present.  Cardiovascular: Normal rate, regular rhythm, normal heart sounds, intact distal pulses and normal pulses.  Pulmonary/Chest: No accessory muscle usage. No respiratory distress. She has rhonchi.  Abdominal: Soft. Bowel sounds are normal. There is no tenderness. There is no guarding.  Musculoskeletal: Normal range of motion.  Lymphadenopathy:       Head (right side): No submandibular adenopathy present.       Head (left side): No submandibular adenopathy present.    She has no cervical adenopathy.  Neurological: She is alert and oriented to person, place, and time. She has normal strength. No cranial nerve deficit or sensory deficit.  Skin: Skin is warm and dry.  Psychiatric: She has a normal mood and affect. Her speech is normal.  Nursing note and vitals reviewed.    ED Treatments / Results  Labs (all labs ordered are listed, but only abnormal results are displayed) Labs Reviewed - No data to display  EKG  EKG Interpretation None       Radiology Dg Chest 2 View  Result Date: 12/13/2017 CLINICAL DATA:  Non productive cough x couple months, worse in last week, sob with exertion, vomiting. Denies chest pain or fever. No prior history. EXAM: CHEST - 2 VIEW COMPARISON:  09/30/2014 FINDINGS: Heart size is normal. There is mild perihilar peribronchial thickening. There are no focal consolidations or pleural effusions. IMPRESSION: Bronchitic changes.  No focal acute pulmonary abnormality. Electronically Signed   By: Norva Pavlov M.D.   On: 12/13/2017 19:08    Procedures Procedures (including critical care time)  Medications Ordered in ED Medications - No data to display   Initial Impression / Assessment and Plan / ED Course  I have reviewed the triage vital signs and the nursing notes.  Pertinent labs & imaging results that were available during my care of the patient were reviewed by me and  considered in my medical decision making (see chart for details).       Final Clinical Impressions(s) / ED Diagnoses MDM  Blood pressure is elevated at 127/103, otherwise vital signs within normal limits.  Pulse oximetry is 99% on room air.  Within normal limits by my interpretation.  The patient has congestion in the nasal passages on examination.  The patient also has rhonchi present on the exam.  She is not using accessory muscles, and she is speaking in complete sentences without problem.  Chest x-ray shows bronchitic changes present, but no acute pulmonary abnormality.  The patient will be treated with an albuterol inhaler, short course of steroids, ibuprofen for soreness and aching.  The patient will also use Claritin-D for congestion.  I discussed with the patient the importance of good handwashing as well as the importance of good hydration.  Patient will see the Medicaid access physician or return to the emergency department if  any changes in condition, problems, or concerns.   Final diagnoses:  Acute bronchitis, unspecified organism  Other sinusitis, unspecified chronicity    ED Discharge Orders    None       Ivery Quale, PA-C 12/13/17 2026    Eber Hong, MD 12/14/17 1010

## 2017-12-13 NOTE — Discharge Instructions (Signed)
It is important that you increase water, Kool-Aid, juices, Gatorade, etc.  Hydration is extremely important.  Please wash hands frequently.  Use Claritin-D every 12 hours for nasal congestion.  Use 2 puffs of albuterol every 4 hours for assistance with cough and breathing.  Use Decadron 2 times daily with food.  Use ibuprofen every 6 hours for aching and soreness.  Please see your Medicaid access physician for additional evaluation and management.  Return to the emergency department if any emergent changes, problems, or concerns.

## 2020-05-19 ENCOUNTER — Emergency Department (HOSPITAL_COMMUNITY)
Admission: EM | Admit: 2020-05-19 | Discharge: 2020-05-19 | Disposition: A | Discharge status: Home / Self Care | Payer: Medicaid Other | Attending: Emergency Medicine | Admitting: Emergency Medicine

## 2020-05-19 ENCOUNTER — Other Ambulatory Visit: Payer: Self-pay

## 2020-05-19 DIAGNOSIS — Z20822 Contact with and (suspected) exposure to covid-19: Secondary | ICD-10-CM | POA: Insufficient documentation

## 2020-05-19 DIAGNOSIS — J020 Streptococcal pharyngitis: Secondary | ICD-10-CM

## 2020-05-19 DIAGNOSIS — J029 Acute pharyngitis, unspecified: Secondary | ICD-10-CM | POA: Diagnosis not present

## 2020-05-19 LAB — GROUP A STREP BY PCR: Group A Strep by PCR: DETECTED — AB

## 2020-05-19 LAB — SARS CORONAVIRUS 2 BY RT PCR (HOSPITAL ORDER, PERFORMED IN ~~LOC~~ HOSPITAL LAB): SARS Coronavirus 2: NEGATIVE

## 2020-05-19 MED ORDER — AMOXICILLIN 500 MG PO CAPS: 500.0000 mg | ORAL_CAPSULE | Freq: Two times a day (BID) | ORAL | 0 refills | Status: DC

## 2020-05-19 NOTE — ED Triage Notes (Signed)
Pt. States they have had a sore throat since last Wednesday. Pt. States they have not been exposed to covid. Pt. Also states they have had their covid vaccines.

## 2020-05-19 NOTE — ED Provider Notes (Signed)
[facility] EMERGENCY DEPARTMENT Provider Note   CSN: [id number] Arrival date & time: 05/19/20  1024     History Chief Complaint  Patient presents with  . Sore Throat    Olivia Small is a 34 y.o. female presents emergency department with chief complaint of sore throat.  Onset of symptoms 5 days ago.  She has some pain with swallowing.  She is a chronic cough which is unchanged.  She denies fevers or chills.  She has been vaccinated for the coronavirus.  Her son has the same symptoms.                                                                                                                                                                                                                                                 Past Medical History:  Diagnosis Date  . 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 No pertinent past medical history     Patient Active Problem List   Diagnosis Date Noted  . Rh negative state in antepartum period 01/29/2013  . Rapid first stage of labor 01/29/2013  . Supervision of other normal pregnancy 01/08/2013  . 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 CHOLECYSTECTOMY  05/10/2012   Procedure: LAPAROSCOPIC CHOLECYSTECTOMY;  Surgeon: Fabio Bering, MD;  Location: [facility];  Service: General;  Laterality: N/A;  . TUBAL LIGATION Bilateral 02/17/2013   Procedure: POST PARTUM TUBAL LIGATION;  Surgeon: Adam Phenix, MD;  Location: [facility];  Service: Gynecology;  Laterality: Bilateral;     OB History    Gravida  4   Para  4   Term  4   Preterm  0   AB  0   Living  4     SAB  0   TAB  0   Ectopic  0   Multiple  0   Live Births  2           Family History  Problem Relation Age of Onset  . Diabetes Father   . Cancer Sister        cervical    Social History   Tobacco Use  . Smoking status: Never  Smoker  . Smokeless tobacco: Never Used  Substance  Use Topics  . Alcohol use: Yes    Comment: occassionally   . Drug use: No    Home Medications Prior to Admission medications   Medication Sig Start Date End Date Taking? Authorizing Provider  dexamethasone (DECADRON) 4 MG tablet Take 1 tablet (4 mg total) by mouth 2 (two) times daily with a meal. 12/13/17   Ivery Quale, PA-C  HYDROcodone-homatropine [employer]) 5-1.5 MG/5ML syrup Take 5 mLs by mouth every 6 (six) hours as needed. 12/13/17   Ivery Quale, PA-C  ibuprofen (ADVIL,MOTRIN) 600 MG tablet Take 1 tablet (600 mg total) by mouth 4 (four) times daily. 12/13/17   Ivery Quale, PA-C  Pseudoeph-Doxylamine-DM-APAP (NYQUIL MULTI-SYMPTOM PO) Take 30 mLs by mouth at bedtime.    [provider]    Allergies    Patient has no known allergies.  Review of Systems   Review of Systems Ten systems reviewed and are negative for acute change, except as noted in the HPI.   Physical Exam Updated Vital Signs BP 129/89   Pulse 90   Temp 98.4 F (36.9 C) (Oral)   Resp 18   Ht 5\' 5"  (1.651 m)   Wt 122.5 kg   SpO2 97%   BMI 44.93 kg/m   Physical Exam Vitals and nursing note reviewed.  Constitutional:      General: She is not in acute distress.    Appearance: She is well-developed. She is not diaphoretic.  HENT:     Head: Normocephalic and atraumatic.     Right Ear: Tympanic membrane normal.     Left Ear: Tympanic membrane normal.     Mouth/Throat:     Mouth: No oral lesions.     Pharynx: Posterior oropharyngeal erythema present. No pharyngeal swelling, oropharyngeal exudate or uvula swelling.  Eyes:     General: No scleral icterus.    Conjunctiva/sclera: Conjunctivae normal.  Cardiovascular:     Rate and Rhythm: Normal rate and regular rhythm.     Heart sounds: Normal heart sounds. No murmur heard.  No friction rub. No gallop.   Pulmonary:     Effort: Pulmonary effort is normal. No respiratory distress.     Breath  sounds: Normal breath sounds.  Abdominal:     General: Bowel sounds are normal. There is no distension.     Palpations: Abdomen is soft. There is no mass.     Tenderness: There is no abdominal tenderness. There is no guarding.  Musculoskeletal:     Cervical back: Normal range of motion.  Lymphadenopathy:     Head:     Right side of head: No tonsillar adenopathy.     Left side of head: No tonsillar adenopathy.  Skin:    General: Skin is warm and dry.  Neurological:     Mental Status: She is alert and oriented to person, place, and time.  Psychiatric:        Behavior: Behavior normal.     ED Results / Procedures / Treatments   Labs (all labs ordered are listed, but only abnormal results are displayed) Labs Reviewed  GROUP A STREP BY PCR  SARS CORONAVIRUS 2 BY RT PCR (HOSPITAL ORDER, PERFORMED IN [facility])    EKG None  Radiology No results found.  Procedures Procedures (including critical care time)  Medications Ordered in ED Medications - No data to display  ED Course  I have reviewed the triage vital signs and the nursing notes.  Pertinent labs & imaging results that were available during  my care of the patient were reviewed by me and considered in my medical decision making (see chart for details).    MDM Rules/Calculators/A&P                         Pt afebrile with tonsillar exudate, & dysphagia; diagnosis of strep. Presentation non concerning for PTA or infxn spread to soft tissue. No trismus or uvula deviation.d/c with amoxil. Specific return precautions discussed. Pt able to drink water in ED without difficulty with intact air way. Recommended PCP follow up.    Olivia Small was evaluated in Emergency Department on 05/19/2020 for the symptoms described in the history of present illness. She was evaluated in the context of the global COVID-19 pandemic, which necessitated consideration that the patient might be at risk for infection with the  SARS-CoV-2 virus that causes COVID-19. Institutional protocols and algorithms that pertain to the evaluation of patients at risk for COVID-19 are in a state of rapid change based on information released by regulatory bodies including the CDC and federal and state organizations. These policies and algorithms were followed during the patient's care in the ED.      Final Clinical Impression(s) / ED Diagnoses Final diagnoses:  None    Rx / DC Orders ED Discharge Orders    None       Arthor Captain, PA-C 05/19/20 1642    Eber Hong, MD 05/20/20 [phone]

## 2020-05-19 NOTE — Discharge Instructions (Signed)
Get help right away if: You have new symptoms, such as vomiting, severe headache, stiff or painful neck, chest pain, or shortness of breath. You have severe throat pain, drooling, or changes in your voice. You have swelling of the neck, or the skin on the neck becomes red and tender. You have signs of dehydration, such as tiredness (fatigue), dry mouth, and decreased urination. You become increasingly sleepy, or you cannot wake up completely. Your joints become red or painful. 

## 2021-06-23 ENCOUNTER — Other Ambulatory Visit (HOSPITAL_COMMUNITY): Payer: Self-pay | Admitting: Family Medicine

## 2021-06-23 DIAGNOSIS — N63 Unspecified lump in unspecified breast: Secondary | ICD-10-CM

## 2021-07-13 ENCOUNTER — Other Ambulatory Visit: Payer: Self-pay

## 2021-07-13 ENCOUNTER — Encounter (HOSPITAL_COMMUNITY): Payer: Self-pay | Admitting: *Deleted

## 2021-07-13 ENCOUNTER — Emergency Department (HOSPITAL_COMMUNITY)
Admission: EM | Admit: 2021-07-13 | Discharge: 2021-07-13 | Disposition: A | Discharge status: Home / Self Care | Payer: Medicaid Other | Attending: Emergency Medicine | Admitting: Emergency Medicine

## 2021-07-13 DIAGNOSIS — K047 Periapical abscess without sinus: Secondary | ICD-10-CM

## 2021-07-13 DIAGNOSIS — K0889 Other specified disorders of teeth and supporting structures: Secondary | ICD-10-CM | POA: Diagnosis not present

## 2021-07-13 MED ORDER — NAPROXEN 500 MG PO TABS: 500.0000 mg | ORAL_TABLET | Freq: Two times a day (BID) | ORAL | 0 refills | Status: DC

## 2021-07-13 MED ORDER — KETOROLAC TROMETHAMINE 30 MG/ML IJ SOLN
30.0000 mg | Freq: Once | INTRAMUSCULAR | Status: AC
  Administered 2021-07-13: 30 mg via INTRAMUSCULAR
  Filled 2021-07-13: qty 1

## 2021-07-13 MED ORDER — AMOXICILLIN-POT CLAVULANATE 875-125 MG PO TABS: 1.0000 | ORAL_TABLET | Freq: Two times a day (BID) | ORAL | 0 refills | Status: DC

## 2021-07-13 NOTE — ED Triage Notes (Signed)
Toothache right lower jaw x 2 weeks, facial swelling onset yesterday

## 2021-07-13 NOTE — Discharge Instructions (Addendum)
Take Augmentin twice daily for the next 7 days. Take naproxen twice daily with food and water for the next 7 days. Follow-up with your dentist as scheduled this Wednesday. If you start having difficulty swallowing, significant pain under your tongue, vomiting, fever please return back to the ED for additional evaluation.

## 2021-07-13 NOTE — ED Provider Notes (Signed)
[facility] EMERGENCY DEPARTMENT Provider Note   CSN: [id number] Arrival date & time: 07/13/21  1108     History Chief Complaint  Patient presents with   Oral Swelling    Olivia Small is a 35 y.o. female.  HPI  Patient presents with dental pain.  She has been having pain to the right lower jaw for 2 weeks, stated last night she started having swelling surrounding the right jaw.  She is able to swallow, handling secretions well.  Denies any fevers at home, states the pain is constant.  The pain is worsened by chewing.  She has tried ibuprofen without any relief.  Patient called her dentist 2 weeks ago, she does have an appointment in 2 days.  Denies any swelling underneath her tongue, denies any discharge.  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    Past Surgical History:  Procedure Laterality Date   CHOLECYSTECTOMY  05/10/2012   Procedure: LAPAROSCOPIC CHOLECYSTECTOMY;  Surgeon: Fabio Bering, MD;  Location: [facility];  Service: General;  Laterality: N/A;   TUBAL LIGATION Bilateral 02/17/2013   Procedure: POST PARTUM TUBAL LIGATION;  Surgeon: Adam Phenix, MD;  Location: [facility];  Service: Gynecology;  Laterality: Bilateral;     OB History     Gravida  4   Para  4   Term  4   Preterm  0   AB  0   Living  4      SAB  0   IAB  0   Ectopic  0   Multiple  0   Live Births  2           Family History  Problem Relation Age of Onset   Diabetes Father    Cancer Sister        cervical    Social History   Tobacco Use   Smoking status: Never   Smokeless tobacco: Never  Substance Use Topics    Alcohol use: Yes    Comment: occassionally    Drug use: No    Home Medications Prior to Admission medications   Medication Sig Start Date End Date Taking? Authorizing Provider  amoxicillin (AMOXIL) 500 MG capsule Take 1 capsule (500 mg total) by mouth 2 (two) times daily. 05/19/20   Arthor Captain, PA-C  dexamethasone (DECADRON) 4 MG tablet Take 1 tablet (4 mg total) by mouth 2 (two) times daily with a meal. 12/13/17   Ivery Quale, PA-C  HYDROcodone-homatropine [facility]) 5-1.5 MG/5ML syrup Take 5 mLs by mouth every 6 (six) hours as needed. 12/13/17   Ivery Quale, PA-C  ibuprofen (ADVIL,MOTRIN) 600 MG tablet Take 1 tablet (600 mg total) by mouth 4 (four) times daily. 12/13/17   Ivery Quale, PA-C  Pseudoeph-Doxylamine-DM-APAP (NYQUIL MULTI-SYMPTOM PO) Take 30 mLs by mouth at bedtime.    [provider]    Allergies    Patient has no known allergies.  Review of Systems   Review of Systems  Constitutional:  Negative for fever.  HENT:  Positive for dental problem and facial swelling. Negative for drooling and sore throat.  Physical Exam Updated Vital Signs BP (!) 156/96 (BP Location: Right Arm)   Pulse 98   Temp 99.2 F (37.3 C) (Oral)   Resp 16   Ht 5\' 6"  (1.676 m)   Wt 117.7 kg   LMP 07/08/2021   SpO2 99%   BMI 41.89 kg/m   Physical Exam Vitals and nursing note reviewed. Exam conducted with a chaperone present.  Constitutional:      Appearance: Normal appearance.  HENT:     Head: Normocephalic.     Mouth/Throat:     Mouth: Mucous membranes are moist.     Pharynx: No oropharyngeal exudate or posterior oropharyngeal erythema.     Comments: No sublingual tenderness or swelling. Uvula is midline. No trismus. No dry sockets or pulp exposure. Handling secretions without difficulty.  Patient does have area of fluctuation to the right jaw.  No obvious purulent material, tender to palpation. Eyes:     Extraocular Movements: Extraocular movements intact.      Pupils: Pupils are equal, round, and reactive to light.  Cardiovascular:     Rate and Rhythm: Normal rate and regular rhythm.  Pulmonary:     Effort: Pulmonary effort is normal.     Breath sounds: Normal breath sounds.  Musculoskeletal:     Cervical back: Normal range of motion. No rigidity or tenderness.  Neurological:     Mental Status: She is alert.  Psychiatric:        Mood and Affect: Mood normal.    ED Results / Procedures / Treatments   Labs (all labs ordered are listed, but only abnormal results are displayed) Labs Reviewed - No data to display  EKG None  Radiology No results found.  Procedures Procedures   Medications Ordered in ED Medications  ketorolac (TORADOL) 30 MG/ML injection 30 mg (has no administration in time range)    ED Course  I have reviewed the triage vital signs and the nursing notes.  Pertinent labs & imaging results that were available during my care of the patient were reviewed by me and considered in my medical decision making (see chart for details).    MDM Rules/Calculators/A&P                           Patient vitals are stable, she does have an elevated temperature and a slightly elevated pulse.  However, patient is not tachycardic or febrile.  She does not appear septic.  She does have an area that is consistent with an abscess, no sublingual tenderness that be concerning for [place] angina.  Uvula is midline, do not think this is a retropharyngeal or PTA.  No trismus, handling secretions well.  Patient does have follow-up with a dentist in 2 days, think it is appropriate to treat the pain and give antibiotics for the next 2 days and have her follow-up with them as planned.  Patient is stable at this time and appropriate for discharge, return precautions given.  Patient discharged in stable position.  Final Clinical Impression(s) / ED Diagnoses Final diagnoses:  None    Rx / DC Orders ED Discharge Orders     None        09/07/2021, PA-C 07/13/21 1214    07/15/21, DO 07/14/21 [phone]

## 2021-07-14 ENCOUNTER — Ambulatory Visit (HOSPITAL_COMMUNITY)
Admission: RE | Admit: 2021-07-14 | Discharge: 2021-07-14 | Disposition: A | Discharge status: Home / Self Care | Payer: Medicaid Other | Source: Ambulatory Visit | Attending: Family Medicine | Admitting: Family Medicine

## 2021-07-14 ENCOUNTER — Other Ambulatory Visit (HOSPITAL_COMMUNITY): Payer: Self-pay | Admitting: Family Medicine

## 2021-07-14 ENCOUNTER — Encounter (HOSPITAL_COMMUNITY): Payer: Self-pay

## 2021-07-14 DIAGNOSIS — N63 Unspecified lump in unspecified breast: Secondary | ICD-10-CM | POA: Diagnosis present

## 2021-07-14 DIAGNOSIS — R928 Other abnormal and inconclusive findings on diagnostic imaging of breast: Secondary | ICD-10-CM

## 2021-07-22 ENCOUNTER — Other Ambulatory Visit (HOSPITAL_COMMUNITY): Payer: Self-pay | Admitting: Family Medicine

## 2021-07-22 ENCOUNTER — Ambulatory Visit (HOSPITAL_COMMUNITY)
Admission: RE | Admit: 2021-07-22 | Discharge: 2021-07-22 | Disposition: A | Discharge status: Home / Self Care | Payer: Medicaid Other | Source: Ambulatory Visit | Attending: Family Medicine | Admitting: Family Medicine

## 2021-07-22 ENCOUNTER — Encounter (HOSPITAL_COMMUNITY): Payer: Self-pay

## 2021-07-22 ENCOUNTER — Other Ambulatory Visit: Payer: Self-pay

## 2021-07-22 DIAGNOSIS — R928 Other abnormal and inconclusive findings on diagnostic imaging of breast: Secondary | ICD-10-CM

## 2021-07-22 MED ORDER — LIDOCAINE HCL (PF) 2 % IJ SOLN
10.0000 mL | Freq: Once | INTRAMUSCULAR | Status: AC
  Administered 2021-07-22: 10 mL

## 2021-07-22 MED ORDER — LIDOCAINE-EPINEPHRINE (PF) 2 %-1:200000 IJ SOLN
10.0000 mL | Freq: Once | INTRAMUSCULAR | Status: AC
  Administered 2021-07-22: 10 mL via INTRADERMAL
  Filled 2021-07-22: qty 10

## 2021-07-22 NOTE — Progress Notes (Signed)
PT tolerated right breast biopsy well today with NAD noted. PT verbalized understanding of discharge instructions. PT ambulated back to the mammogram area this time and given ice pack. Specimens sent to lab at this time by ultrasound tech.

## 2021-07-23 LAB — SURGICAL PATHOLOGY

## 2021-12-29 NOTE — H&P (Signed)
?  Patient: Olivia Small  PID: [id number]  DOB: 14-May-1986  SEX: Female  ? ?Patient referred by DDS for extraction teeth 1, 2, 17, 30, 31, 32. ? ?CC: Painful teeth. ? ?Past Medical History:  None, Morbid Obesity   ? ?Medications: None   ? ?Allergies:     NKDA   ? ?Surgeries:   gallbladder removal    ? ?Social History       ?Smoking: n          ?Alcohol:n ?Drug use:n            ?                 ?Exam: BMI 43. Decay #1, 2, 30, 31, 32. 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   No purulence, edema, fluctuance, trismus. Oral cancer screening negative. Pharynx clear. No lymphadenopathy. ? ?Panorex: Decay #1, 2, 30, 31, 32.  Erupted 16, 17. ? ?Assessment: ASA 2. Non-restorable  1, 2, 16, 17, 30, 31, 32.             ? ?Plan: Extraction Teeth #  1, 2, 16, 17, 30, 31, 32.        [facility].                ? ?Rx: n              ? ?Risks and complications explained. Questions answered.  ? ?Georgia Lopes, DMD ? ?

## 2021-12-31 ENCOUNTER — Other Ambulatory Visit: Payer: Self-pay

## 2021-12-31 ENCOUNTER — Encounter (HOSPITAL_COMMUNITY): Payer: Self-pay | Admitting: Oral Surgery

## 2021-12-31 NOTE — Progress Notes (Signed)
Olivia Small denies chest pain or shortness of breath.  Patient denies having any s/s of Covid in her household.  Patient denies any known exposure to Covid.  ? ?Olivia Small does not have a PCP. ? ?I instructed Olivia Small to shower with antibacteria soap.  Do not shave. No nail polish, artificial or acrylic nails. Wear clean clothes, brush your teeth. ?Glasses, contact lens,dentures or partials may not be worn in the OR. If you need to wear them, please bring a case for glasses, do not wear contacts or bring a case, the hospital does not have contact cases, dentures or partials will have to be removed , make sure they are clean, we will provide a denture cup to put them in. You will need some one to drive you home and a responsible person over the age of 72 to stay with you for the first 24 hours after surgery. Olivia Small states she willput plastic in place of nose and lip piercings. ?

## 2022-01-01 ENCOUNTER — Encounter (HOSPITAL_COMMUNITY): Payer: Self-pay | Admitting: Oral Surgery

## 2022-01-01 ENCOUNTER — Encounter (HOSPITAL_COMMUNITY)
Admission: RE | Disposition: A | Discharge status: Home / Self Care | Payer: Self-pay | Source: Home / Self Care | Attending: Oral Surgery

## 2022-01-01 ENCOUNTER — Ambulatory Visit (HOSPITAL_BASED_OUTPATIENT_CLINIC_OR_DEPARTMENT_OTHER): Payer: Medicaid Other | Admitting: Certified Registered"

## 2022-01-01 ENCOUNTER — Other Ambulatory Visit: Payer: Self-pay

## 2022-01-01 ENCOUNTER — Ambulatory Visit (HOSPITAL_COMMUNITY)
Admission: RE | Admit: 2022-01-01 | Discharge: 2022-01-01 | Disposition: A | Discharge status: Home / Self Care | Payer: Medicaid Other | Attending: Oral Surgery | Admitting: Oral Surgery

## 2022-01-01 ENCOUNTER — Ambulatory Visit (HOSPITAL_COMMUNITY): Payer: Medicaid Other | Admitting: Certified Registered"

## 2022-01-01 DIAGNOSIS — Z6841 Body Mass Index (BMI) 40.0 and over, adult: Secondary | ICD-10-CM | POA: Diagnosis not present

## 2022-01-01 DIAGNOSIS — K029 Dental caries, unspecified: Secondary | ICD-10-CM

## 2022-01-01 DIAGNOSIS — K0889 Other specified disorders of teeth and supporting structures: Secondary | ICD-10-CM | POA: Diagnosis not present

## 2022-01-01 HISTORY — DX: Other specified health status: Z78.9

## 2022-01-01 HISTORY — PX: TOOTH EXTRACTION: SHX859

## 2022-01-01 LAB — POCT PREGNANCY, URINE: Preg Test, Ur: NEGATIVE

## 2022-01-01 SURGERY — DENTAL RESTORATION/EXTRACTIONS
Anesthesia: General

## 2022-01-01 MED ORDER — ROCURONIUM BROMIDE 10 MG/ML (PF) SYRINGE
PREFILLED_SYRINGE | INTRAVENOUS | Status: AC
  Filled 2022-01-01: qty 10

## 2022-01-01 MED ORDER — LIDOCAINE 2% (20 MG/ML) 5 ML SYRINGE
INTRAMUSCULAR | Status: AC
  Filled 2022-01-01: qty 5

## 2022-01-01 MED ORDER — LACTATED RINGERS IV SOLN: INTRAVENOUS | Status: DC

## 2022-01-01 MED ORDER — AMOXICILLIN 500 MG PO CAPS: 500.0000 mg | ORAL_CAPSULE | Freq: Three times a day (TID) | ORAL | 0 refills | Status: AC

## 2022-01-01 MED ORDER — CHLORHEXIDINE GLUCONATE 0.12 % MT SOLN
15.0000 mL | Freq: Once | OROMUCOSAL | Status: AC
  Administered 2022-01-01: 15 mL via OROMUCOSAL
  Filled 2022-01-01: qty 15

## 2022-01-01 MED ORDER — ORAL CARE MOUTH RINSE: 15.0000 mL | Freq: Once | OROMUCOSAL | Status: AC

## 2022-01-01 MED ORDER — ONDANSETRON HCL 4 MG/2ML IJ SOLN
INTRAMUSCULAR | Status: AC
  Filled 2022-01-01: qty 2

## 2022-01-01 MED ORDER — PROPOFOL 10 MG/ML IV BOLUS
INTRAVENOUS | Status: DC | PRN
  Administered 2022-01-01: 200 mg via INTRAVENOUS
  Administered 2022-01-01: 50 mg via INTRAVENOUS

## 2022-01-01 MED ORDER — PROPOFOL 10 MG/ML IV BOLUS
INTRAVENOUS | Status: AC
  Filled 2022-01-01: qty 20

## 2022-01-01 MED ORDER — ACETAMINOPHEN 500 MG PO TABS
1000.0000 mg | ORAL_TABLET | Freq: Once | ORAL | Status: AC
  Administered 2022-01-01: 1000 mg via ORAL
  Filled 2022-01-01: qty 2

## 2022-01-01 MED ORDER — FENTANYL CITRATE (PF) 250 MCG/5ML IJ SOLN
INTRAMUSCULAR | Status: DC | PRN
  Administered 2022-01-01: 50 ug via INTRAVENOUS
  Administered 2022-01-01: 100 ug via INTRAVENOUS
  Administered 2022-01-01: 50 ug via INTRAVENOUS

## 2022-01-01 MED ORDER — ONDANSETRON HCL 4 MG/2ML IJ SOLN
INTRAMUSCULAR | Status: DC | PRN
  Administered 2022-01-01: 4 mg via INTRAVENOUS

## 2022-01-01 MED ORDER — SCOPOLAMINE 1 MG/3DAYS TD PT72
1.0000 | MEDICATED_PATCH | TRANSDERMAL | Status: DC
  Administered 2022-01-01: 1.5 mg via TRANSDERMAL
  Filled 2022-01-01: qty 1

## 2022-01-01 MED ORDER — SODIUM CHLORIDE 0.9 % IR SOLN
Status: DC | PRN
  Administered 2022-01-01: 1000 mL

## 2022-01-01 MED ORDER — OXYCODONE-ACETAMINOPHEN 5-325 MG PO TABS: 1.0000 | ORAL_TABLET | ORAL | 0 refills | Status: AC | PRN

## 2022-01-01 MED ORDER — MIDAZOLAM HCL 2 MG/2ML IJ SOLN
INTRAMUSCULAR | Status: AC
  Filled 2022-01-01: qty 2

## 2022-01-01 MED ORDER — LIDOCAINE 2% (20 MG/ML) 5 ML SYRINGE
INTRAMUSCULAR | Status: DC | PRN
  Administered 2022-01-01: 30 mg via INTRAVENOUS

## 2022-01-01 MED ORDER — DEXAMETHASONE SODIUM PHOSPHATE 10 MG/ML IJ SOLN
INTRAMUSCULAR | Status: AC
  Filled 2022-01-01: qty 1

## 2022-01-01 MED ORDER — FENTANYL CITRATE (PF) 250 MCG/5ML IJ SOLN
INTRAMUSCULAR | Status: AC
  Filled 2022-01-01: qty 5

## 2022-01-01 MED ORDER — MIDAZOLAM HCL 2 MG/2ML IJ SOLN
INTRAMUSCULAR | Status: DC | PRN
Start: 2022-01-01 — End: 2022-01-01
  Administered 2022-01-01: 2 mg via INTRAVENOUS

## 2022-01-01 MED ORDER — OXYCODONE HCL 5 MG PO TABS: 5.0000 mg | ORAL_TABLET | Freq: Once | ORAL | Status: DC | PRN

## 2022-01-01 MED ORDER — PROMETHAZINE HCL 25 MG/ML IJ SOLN: 6.2500 mg | INTRAMUSCULAR | Status: DC | PRN

## 2022-01-01 MED ORDER — OXYMETAZOLINE HCL 0.05 % NA SOLN
NASAL | Status: DC | PRN
  Administered 2022-01-01: 1

## 2022-01-01 MED ORDER — DEXAMETHASONE SODIUM PHOSPHATE 10 MG/ML IJ SOLN
INTRAMUSCULAR | Status: DC | PRN
Start: 2022-01-01 — End: 2022-01-01
  Administered 2022-01-01: 5 mg via INTRAVENOUS

## 2022-01-01 MED ORDER — LIDOCAINE-EPINEPHRINE 2 %-1:100000 IJ SOLN
INTRAMUSCULAR | Status: AC
  Filled 2022-01-01: qty 1

## 2022-01-01 MED ORDER — CEFAZOLIN SODIUM-DEXTROSE 2-4 GM/100ML-% IV SOLN
2.0000 g | INTRAVENOUS | Status: AC
  Administered 2022-01-01: 2 g via INTRAVENOUS
  Filled 2022-01-01: qty 100

## 2022-01-01 MED ORDER — PHENYLEPHRINE HCL (PRESSORS) 10 MG/ML IV SOLN
INTRAVENOUS | Status: DC | PRN
  Administered 2022-01-01: 120 ug via INTRAVENOUS

## 2022-01-01 MED ORDER — MIDAZOLAM HCL 2 MG/2ML IJ SOLN: 0.5000 mg | Freq: Once | INTRAMUSCULAR | Status: DC | PRN

## 2022-01-01 MED ORDER — 0.9 % SODIUM CHLORIDE (POUR BTL) OPTIME
TOPICAL | Status: DC | PRN
  Administered 2022-01-01: 1000 mL

## 2022-01-01 MED ORDER — OXYCODONE HCL 5 MG/5ML PO SOLN: 5.0000 mg | Freq: Once | ORAL | Status: DC | PRN

## 2022-01-01 MED ORDER — FENTANYL CITRATE (PF) 100 MCG/2ML IJ SOLN: 25.0000 ug | INTRAMUSCULAR | Status: DC | PRN

## 2022-01-01 MED ORDER — LIDOCAINE-EPINEPHRINE 2 %-1:100000 IJ SOLN
INTRAMUSCULAR | Status: DC | PRN
Start: 2022-01-01 — End: 2022-01-01
  Administered 2022-01-01: 20 mL via INTRADERMAL

## 2022-01-01 MED ORDER — OXYMETAZOLINE HCL 0.05 % NA SOLN
NASAL | Status: AC
  Filled 2022-01-01: qty 30

## 2022-01-01 MED ORDER — MEPERIDINE HCL 25 MG/ML IJ SOLN: 6.2500 mg | INTRAMUSCULAR | Status: DC | PRN

## 2022-01-01 MED ORDER — ROCURONIUM BROMIDE 10 MG/ML (PF) SYRINGE
PREFILLED_SYRINGE | INTRAVENOUS | Status: DC | PRN
  Administered 2022-01-01: 20 mg via INTRAVENOUS
  Administered 2022-01-01: 60 mg via INTRAVENOUS

## 2022-01-01 SURGICAL SUPPLY — 38 items
BAG COUNTER SPONGE SURGICOUNT (BAG) ×1 IMPLANT
BAG SPNG CNTER NS LX DISP (BAG) ×1
BLADE SURG 15 STRL LF DISP TIS (BLADE) ×2 IMPLANT
BLADE SURG 15 STRL SS (BLADE)
BUR CROSS CUT FISSURE 1.6 (BURR) ×3 IMPLANT
BUR EGG ELITE 4.0 (BURR) ×3 IMPLANT
CANISTER SUCT 3000ML PPV (MISCELLANEOUS) ×3 IMPLANT
COVER SURGICAL LIGHT HANDLE (MISCELLANEOUS) ×2 IMPLANT
DECANTER SPIKE VIAL GLASS SM (MISCELLANEOUS) ×2 IMPLANT
DRAPE U-SHAPE 76X120 STRL (DRAPES) ×3 IMPLANT
GAUZE PACKING FOLDED 2  STR (GAUZE/BANDAGES/DRESSINGS) ×2
GAUZE PACKING FOLDED 2 STR (GAUZE/BANDAGES/DRESSINGS) ×2 IMPLANT
GLOVE SURG ENC MOIS LTX SZ6.5 (GLOVE) IMPLANT
GLOVE SURG ENC MOIS LTX SZ7 (GLOVE) IMPLANT
GLOVE SURG ENC MOIS LTX SZ8 (GLOVE) ×3 IMPLANT
GLOVE SURG UNDER POLY LF SZ6.5 (GLOVE) IMPLANT
GLOVE SURG UNDER POLY LF SZ7 (GLOVE) IMPLANT
GOWN STRL REUS W/ TWL LRG LVL3 (GOWN DISPOSABLE) ×2 IMPLANT
GOWN STRL REUS W/ TWL XL LVL3 (GOWN DISPOSABLE) ×2 IMPLANT
GOWN STRL REUS W/TWL LRG LVL3 (GOWN DISPOSABLE) ×2
GOWN STRL REUS W/TWL XL LVL3 (GOWN DISPOSABLE) ×2
IV NS 1000ML (IV SOLUTION) ×2
IV NS 1000ML BAXH (IV SOLUTION) ×2 IMPLANT
KIT BASIN OR (CUSTOM PROCEDURE TRAY) ×3 IMPLANT
KIT TURNOVER KIT B (KITS) ×3 IMPLANT
NDL HYPO 25GX1X1/2 BEV (NEEDLE) ×4 IMPLANT
NEEDLE HYPO 25GX1X1/2 BEV (NEEDLE) ×4 IMPLANT
NS IRRIG 1000ML POUR BTL (IV SOLUTION) ×3 IMPLANT
PAD ARMBOARD 7.5X6 YLW CONV (MISCELLANEOUS) ×3 IMPLANT
SLEEVE IRRIGATION ELITE 7 (MISCELLANEOUS) ×2 IMPLANT
SPONGE SURGIFOAM ABS GEL 12-7 (HEMOSTASIS) IMPLANT
SPONGE T-LAP 4X18 ~~LOC~~+RFID (SPONGE) ×1 IMPLANT
SUT CHROMIC 3 0 PS 2 (SUTURE) ×4 IMPLANT
SYR BULB IRRIG 60ML STRL (SYRINGE) ×3 IMPLANT
SYR CONTROL 10ML LL (SYRINGE) ×2 IMPLANT
TRAY ENT MC OR (CUSTOM PROCEDURE TRAY) ×3 IMPLANT
TUBING IRRIGATION (MISCELLANEOUS) ×3 IMPLANT
YANKAUER SUCT BULB TIP NO VENT (SUCTIONS) ×3 IMPLANT

## 2022-01-01 NOTE — H&P (Signed)
H&P documentation  -History and Physical Reviewed  -Patient has been re-examined  -No change in the plan of care  Srah Ake  

## 2022-01-01 NOTE — Anesthesia Postprocedure Evaluation (Signed)
Anesthesia Post Note ? ?Patient: Olivia Small ? ?Procedure(s) Performed: DENTAL RESTORATION/EXTRACTIONS TEETH ONE, TWO ,SIXTEEN,SEVENTEEN,THIRTY ,THIRTYONE THRITY TWO ? ?  ? ?Patient location during evaluation: PACU ?Anesthesia Type: General ?Level of consciousness: awake and alert, patient cooperative and oriented ?Pain management: pain level controlled ?Vital Signs Assessment: post-procedure vital signs reviewed and stable ?Respiratory status: spontaneous breathing, nonlabored ventilation and respiratory function stable ?Cardiovascular status: blood pressure returned to baseline and stable ?Postop Assessment: no apparent nausea or vomiting and able to ambulate ?Anesthetic complications: no ? ? ?No notable events documented. ? ?Last Vitals:  ?Vitals:  ? 01/01/22 1150 01/01/22 1205  ?BP: 133/67 (!) 147/91  ?Pulse: (!) 107 (!) 107  ?Resp: 20 (!) 22  ?Temp:  36.5 ?C  ?SpO2: 92% 92%  ?  ?Last Pain:  ?Vitals:  ? 01/01/22 1205  ?TempSrc:   ?PainSc: 0-No pain  ? ? ?  ?  ?  ?  ?  ?  ? ?Keanon Bevins,E. Aubreyanna Dorrough ? ? ? ? ?

## 2022-01-01 NOTE — Anesthesia Procedure Notes (Signed)
Procedure Name: Intubation ?Date/Time: 01/01/2022 10:23 AM ?Performed by: Amadeo Garnet, CRNA ?Pre-anesthesia Checklist: Patient identified, Emergency Drugs available, Suction available and Patient being monitored ?Patient Re-evaluated:Patient Re-evaluated prior to induction ?Oxygen Delivery Method: Circle system utilized ?Preoxygenation: Pre-oxygenation with 100% oxygen ?Induction Type: IV induction ?Ventilation: Mask ventilation without difficulty ?Laryngoscope Size: Mac and 4 ?Grade View: Grade I ?Nasal Tubes: Nasal prep performed, Nasal Rae, Magill forceps- large, utilized and Left ?Tube size: 7.0 mm ?Number of attempts: 1 ?Placement Confirmation: ETT inserted through vocal cords under direct vision, positive ETCO2 and breath sounds checked- equal and bilateral ?Secured at: 22 cm ?Tube secured with: Tape ?Dental Injury: Teeth and Oropharynx as per pre-operative assessment  ? ? ? ? ?

## 2022-01-01 NOTE — Op Note (Signed)
NAME: Giancola, Rosealyn E. ?MEDICAL RECORD NO: [id number] ?ACCOUNT NO: [id number] ?DATE OF BIRTH: 1986/07/11 ?FACILITY: [facility] ?LOCATION: [facility] ?PHYSICIAN: Georgia Lopes, DDS ? ?Operative Report  ? ?DATE OF PROCEDURE: 01/01/2022 ? ? ?PREOPERATIVE DIAGNOSIS:  Nonrestorable teeth numbers 1, 2, 16, 17, 30, 31, 32 secondary to dental caries, morbid obesity. ? ?POSTOPERATIVE DIAGNOSIS:   Nonrestorable teeth numbers 1, 2, 16, 17, 30, 31, 32 secondary to dental caries, morbid obesity. ? ?PROCEDURE:  Extraction teeth numbers 1, 2, 16, 17, 30, 31, 32. ? ?SURGEON:  Georgia Lopes, DDS ? ?ANESTHESIA:  General, nasal intubation, Dr. Jean Rosenthal attending. ? ?DESCRIPTION OF PROCEDURE:  The patient was taken to the operating room and placed on the table in supine position.  General anesthesia was administered and nasal endotracheal tube was placed and secured.  The eyes were protected and the patient was  ?draped for surgery.  A timeout was performed.  The posterior pharynx was suctioned and a throat pack was placed.  2% lidocaine 1:100,000 epinephrine was infiltrated in inferior alveolar block on the right and left sides and in buccal and palatal  ?infiltration of the maxilla around the teeth to be extracted.  A bite block was placed on the right side of the mouth and a sweetheart retractor was used to retract the tongue.  A 15 blade was used to make an incision in the gingival sulcus around teeth  ?numbers 16 and 17.  The periosteum was reflected.  The teeth were elevated, but could not be luxated.  Bone was removed in the interproximal space between tooth #16 and 15 and between tooth #17 and 18.  The teeth were elevated again and partially removed ? with a forceps.  Roots were remaining and bone was removed from around the roots of these teeth and the teeth were removed using the 301 elevator to remove the individual roots.  The sockets were then curetted, irrigated and closed with 3-0 chromic.   ?The bite block and sweetheart  retractor were repositioned to the other side of the mouth and the right side was operated.  The 15 blade was used to make an incision around teeth numbers 1 and 2 in the maxilla and around teeth #30, 31 and 32 in the  ?mandible and the periosteum was reflected.  The teeth were elevated, but could not be luxated.  Bone was removed in approximately between these teeth and then the teeth were elevated again and the upper teeth were fractured upon attempted removal,  ?necessitating removal of circumferential bone around the lower roots.  Then, the roots were removed using 301 elevator and rongeurs and then in the mandible the teeth were removed with dental forceps after removing circumferential bone.  Then, the  ?sockets were curetted, irrigated and closed with 3-0 chromic.  The oral cavity was then irrigated and suctioned and a throat pack was removed.  The patient was left under care of anesthesia for extubation and transported to recovery room with plans for  ?discharge home through day surgery. ? ?ESTIMATED BLOOD LOSS:  Minimum. ? ?COMPLICATIONS:  None. ? ?SPECIMENS:  None. ? ? ?SUJ ?D: 01/01/2022 11:28:57 am T: 01/01/2022 11:50:00 pm  ?JOB: [id number]/ [id number]  ?

## 2022-01-01 NOTE — Anesthesia Preprocedure Evaluation (Addendum)
Anesthesia Evaluation  ?Patient identified by MRN, date of birth, ID band ?Patient awake ? ? ? ?Reviewed: ?Allergy & Precautions, NPO status , Patient's Chart, lab work & pertinent test results ? ?History of Anesthesia Complications ?Negative for: history of anesthetic complications ? ?Airway ?Mallampati: I ? ?TM Distance: >3 FB ?Neck ROM: Full ? ? ? Dental ? ?(+) Dental Advisory Given ?  ?Pulmonary ?neg pulmonary ROS,  ?  ?breath sounds clear to auscultation ? ? ? ? ? ? Cardiovascular ?(-) hypertension ?Rhythm:Regular Rate:Normal ? ? ?  ?Neuro/Psych ?negative neurological ROS ?   ? GI/Hepatic ?Neg liver ROS, GERD  Medicated and Controlled,  ?Endo/Other  ?Morbid obesity ? Renal/GU ?negative Renal ROS  ? ?  ?Musculoskeletal ? ? Abdominal ?(+) + obese,   ?Peds ? Hematology ?negative hematology ROS ?(+)   ?Anesthesia Other Findings ? ? Reproductive/Obstetrics ? ?  ? ? ? ? ? ? ? ? ? ? ? ? ? ?  ?  ? ? ? ? ? ? ? ?Anesthesia Physical ?Anesthesia Plan ? ?ASA: 2 ? ?Anesthesia Plan: General  ? ?Post-op Pain Management: Tylenol PO (pre-op)*  ? ?Induction: Intravenous ? ?PONV Risk Score and Plan: 3 and Ondansetron, Dexamethasone and Scopolamine patch - Pre-op ? ?Airway Management Planned: Nasal ETT ? ?Additional Equipment: None ? ?Intra-op Plan:  ? ?Post-operative Plan: Extubation in OR ? ?Informed Consent: I have reviewed the patients History and Physical, chart, labs and discussed the procedure including the risks, benefits and alternatives for the proposed anesthesia with the patient or authorized representative who has indicated his/her understanding and acceptance.  ? ? ? ?Dental advisory given ? ?Plan Discussed with: CRNA and Surgeon ? ?Anesthesia Plan Comments:   ? ? ? ? ? ?Anesthesia Quick Evaluation ? ?

## 2022-01-01 NOTE — Op Note (Signed)
01/01/2022 ? ?11:25 AM ? ?PATIENT:  Olivia Small  36 y.o. female ? ?PRE-OPERATIVE DIAGNOSIS:  NON-RESTORABLE TEETH # 1, 2, 16, 17, 30, 31, 32 SECONDARY TO DENTAL CARIES ? ?POST-OPERATIVE DIAGNOSIS:  SAME ? ?PROCEDURE:  Procedure(s): ?EXTRACTION TEETH ONE, TWO ,SIXTEEN,SEVENTEEN,THIRTY ,THIRTYONE THRITY TWO ? ?SURGEON:  Surgeon(s): ?Ocie Doyne, DMD ? ?ANESTHESIA:   local and general ? ?EBL:  minimal ? ?DRAINS: none  ? ?SPECIMEN:  No Specimen ? ?COUNTS:  YES ? ?PLAN OF CARE: Discharge to home after PACU ? ?PATIENT DISPOSITION:  PACU - hemodynamically stable. ?  ?PROCEDURE DETAILS: ?Dictation # [id number] ? ?Georgia Lopes, DMD ?01/01/2022 ?11:25 AM ? ? ? ? ? ? ? ? ? ? ? ? ? ? ? ?  ?

## 2022-01-01 NOTE — Transfer of Care (Signed)
Immediate Anesthesia Transfer of Care Note ? ?Patient: Olivia Small ? ?Procedure(s) Performed: DENTAL RESTORATION/EXTRACTIONS TEETH ONE, TWO ,SIXTEEN,SEVENTEEN,THIRTY ,THIRTYONE THRITY TWO ? ?Patient Location: PACU ? ?Anesthesia Type:General ? ?Level of Consciousness: awake, alert  and oriented ? ?Airway & Oxygen Therapy: Patient Spontanous Breathing ? ?Post-op Assessment: Report given to RN, Post -op Vital signs reviewed and stable and Patient moving all extremities ? ?Post vital signs: Reviewed and stable ? ?Last Vitals:  ?Vitals Value Taken Time  ?BP 152/55 01/01/22 1134  ?Temp    ?Pulse 118 01/01/22 1136  ?Resp 20 01/01/22 1136  ?SpO2 100 % 01/01/22 1136  ?Vitals shown include unvalidated device data. ? ?Last Pain:  ?Vitals:  ? 01/01/22 0807  ?TempSrc:   ?PainSc: 0-No pain  ?   ? ?  ? ?Complications: No notable events documented. ?

## 2022-01-02 ENCOUNTER — Encounter (HOSPITAL_COMMUNITY): Payer: Self-pay | Admitting: Oral Surgery
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
## 2022-08-16 IMAGING — MG MM BREAST LOCALIZATION CLIP
4 series · 4 of 12 positions shown · non-contrast
Comparison: Previous exam(s).

CLINICAL DATA: Status post ultrasound-guided core needle biopsy of
a 3.4 cm mass in the 2 o'clock position of the right breast.

EXAM:
3D DIAGNOSTIC RIGHT MAMMOGRAM POST ULTRASOUND BIOPSY

[R CC synth-2D]
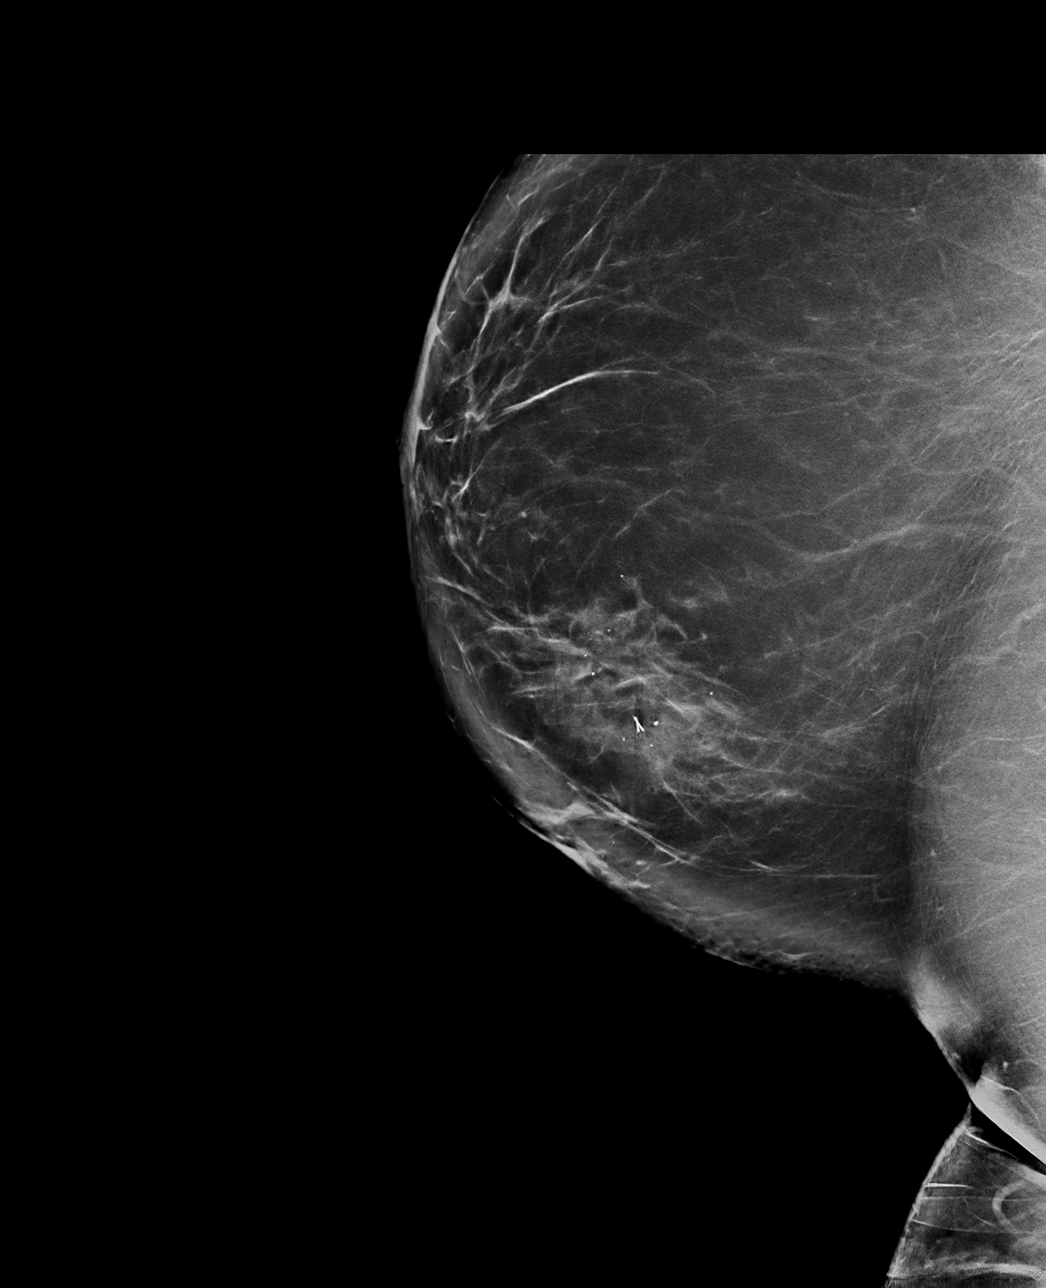

[R ML synth-2D]
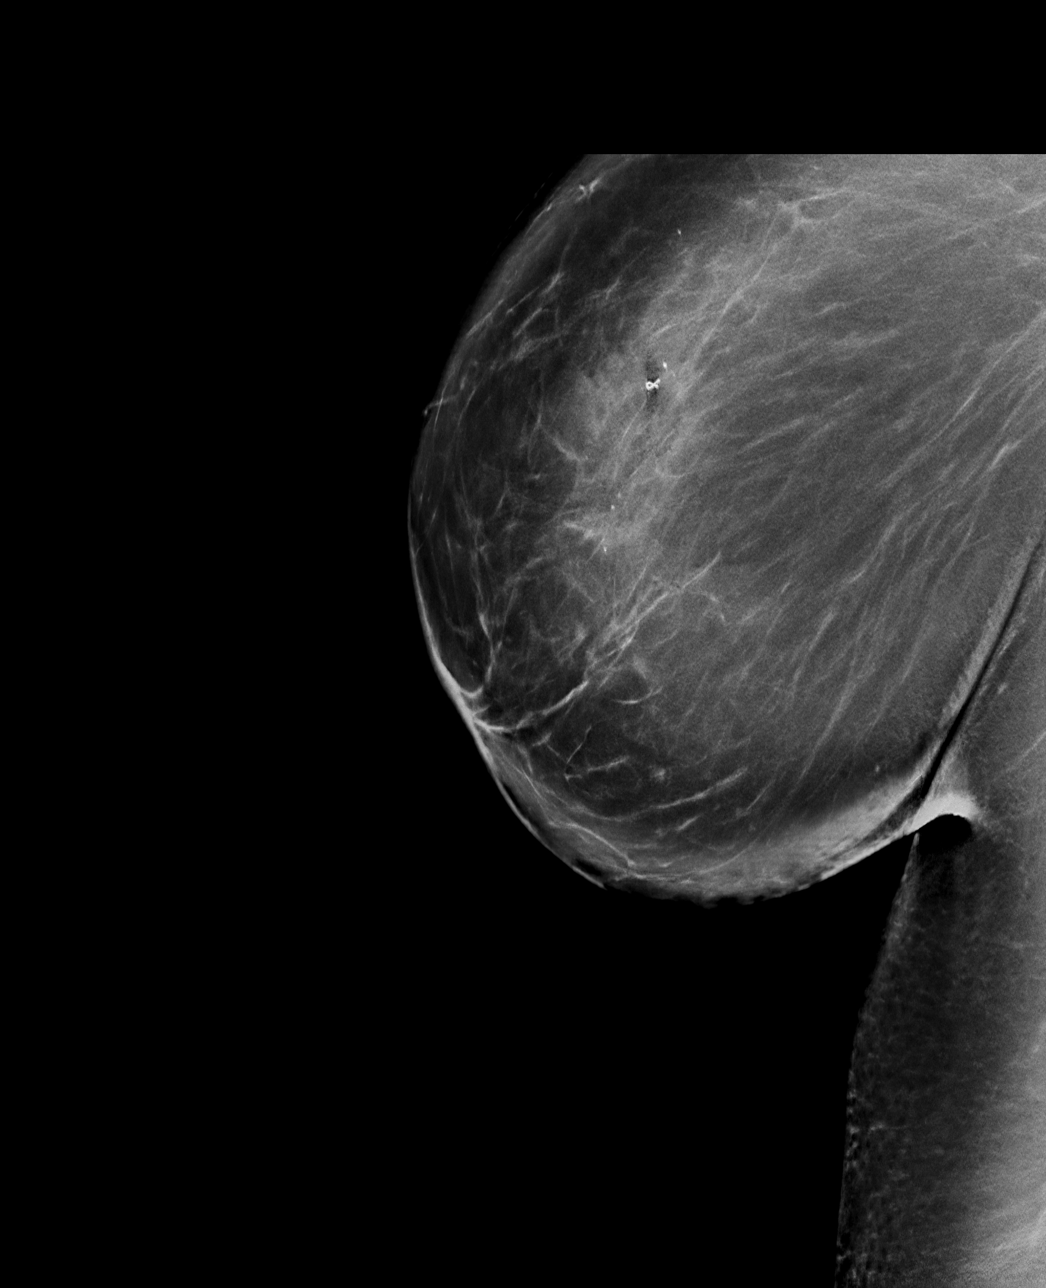

[R ML tomo · tomo slice 63/126.0]
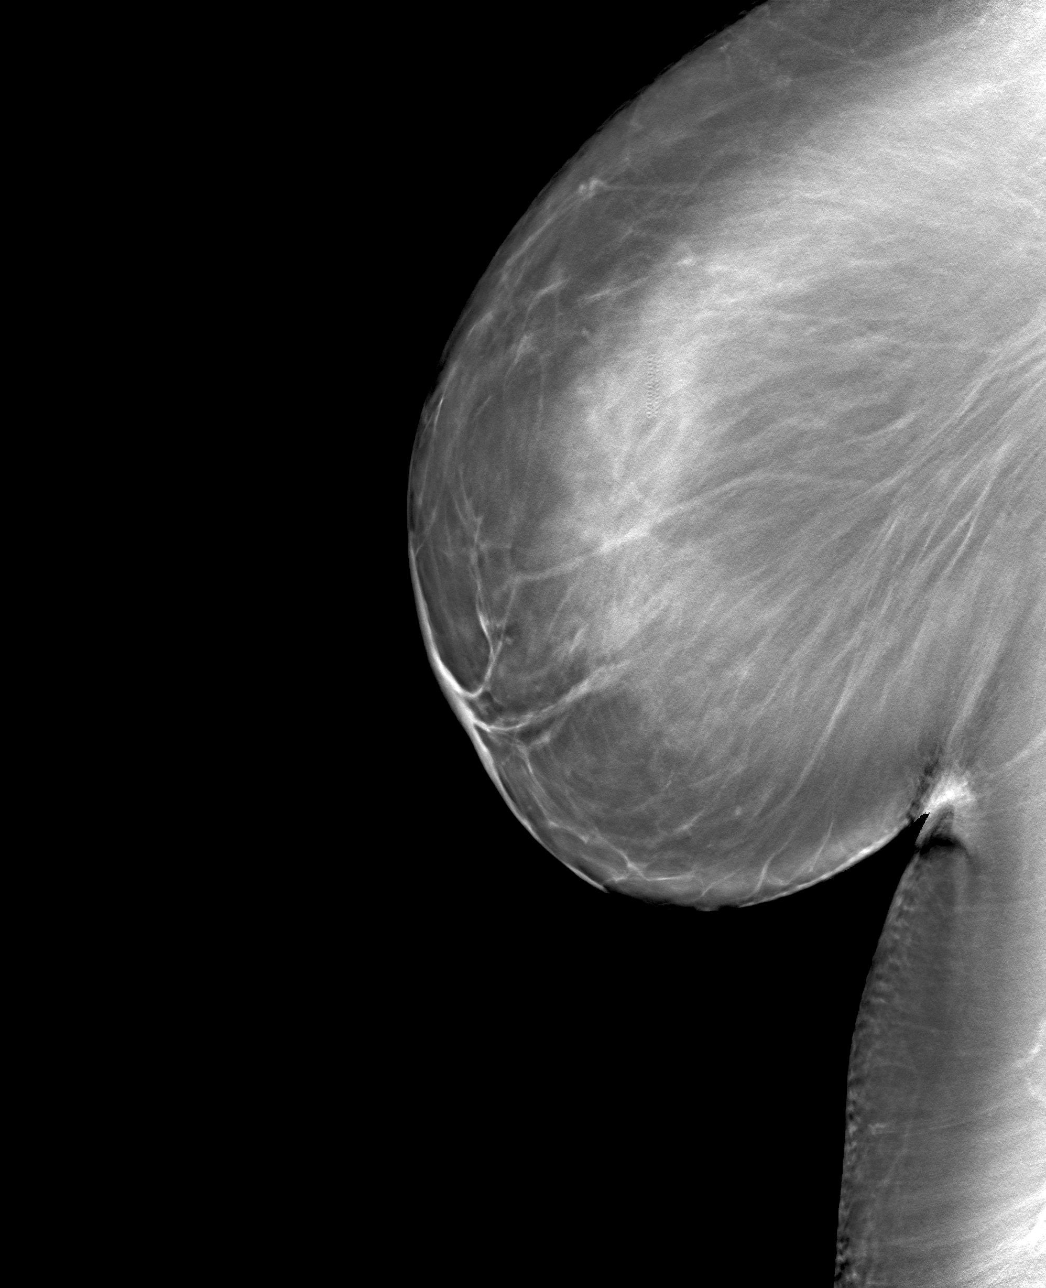

[R CC tomo · tomo slice 57/114.0]
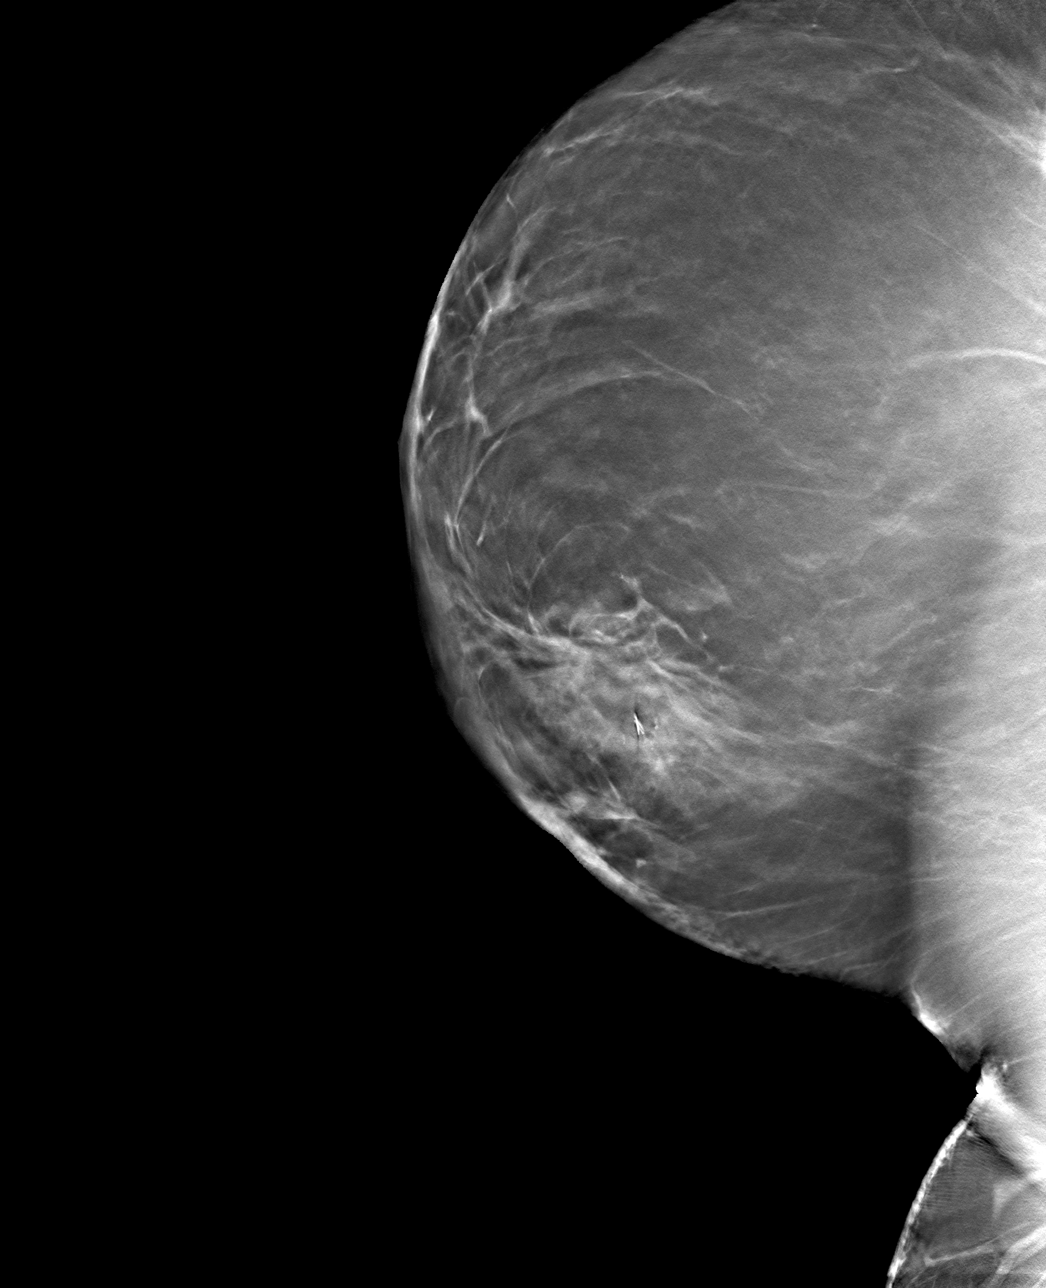

[4 of 12 positions shown; findings below may reference images not displayed]

FINDINGS: 3D Mammographic images were obtained following ultrasound guided
biopsy of the recently demonstrated 3.4 cm mass in the 2 o'clock
position of the right breast. The biopsy marking clip is in expected
position at the site of biopsy.
IMPRESSION: Appropriate positioning of the ribbon shaped biopsy marking clip at
the site of biopsy in the mass in the 2 o'clock position of the
right breast.

Final Assessment: Post Procedure Mammograms for Marker Placement
